# Patient Record
Sex: Male | Born: 1955 | Race: White | Hispanic: No | Marital: Married | Smoking: Never smoker
Health system: Southern US, Community
[De-identification: ages and names within clinical notes are randomized; demographics above are authoritative.]

## PROBLEM LIST (undated history)

## (undated) DIAGNOSIS — J302 Other seasonal allergic rhinitis: Secondary | ICD-10-CM

## (undated) DIAGNOSIS — Z789 Other specified health status: Secondary | ICD-10-CM

## (undated) HISTORY — PX: BACK SURGERY: SHX140

---

## 2014-10-26 ENCOUNTER — Ambulatory Visit: Payer: Self-pay | Admitting: Family Medicine

## 2018-10-19 ENCOUNTER — Other Ambulatory Visit: Payer: Self-pay

## 2018-10-19 ENCOUNTER — Emergency Department: Payer: Managed Care, Other (non HMO)

## 2018-10-19 ENCOUNTER — Encounter: Payer: Self-pay | Admitting: *Deleted

## 2018-10-19 ENCOUNTER — Inpatient Hospital Stay
Admission: EM | Admit: 2018-10-19 | Discharge: 2018-10-22 | DRG: 247 | Disposition: A | Discharge status: Home / Self Care | Payer: Managed Care, Other (non HMO) | Attending: Internal Medicine | Admitting: Internal Medicine

## 2018-10-19 DIAGNOSIS — I209 Angina pectoris, unspecified: Secondary | ICD-10-CM

## 2018-10-19 DIAGNOSIS — I10 Essential (primary) hypertension: Secondary | ICD-10-CM | POA: Diagnosis present

## 2018-10-19 DIAGNOSIS — R739 Hyperglycemia, unspecified: Secondary | ICD-10-CM | POA: Diagnosis present

## 2018-10-19 DIAGNOSIS — R079 Chest pain, unspecified: Secondary | ICD-10-CM | POA: Diagnosis present

## 2018-10-19 DIAGNOSIS — E785 Hyperlipidemia, unspecified: Secondary | ICD-10-CM | POA: Diagnosis present

## 2018-10-19 DIAGNOSIS — I214 Non-ST elevation (NSTEMI) myocardial infarction: Principal | ICD-10-CM | POA: Diagnosis present

## 2018-10-19 HISTORY — DX: Other specified health status: Z78.9

## 2018-10-19 HISTORY — DX: Other seasonal allergic rhinitis: J30.2

## 2018-10-19 LAB — BASIC METABOLIC PANEL
ANION GAP: 6 (ref 5–15)
BUN: 11 mg/dL (ref 8–23)
CALCIUM: 9.2 mg/dL (ref 8.9–10.3)
CO2: 30 mmol/L (ref 22–32)
Chloride: 103 mmol/L (ref 98–111)
Creatinine, Ser: 1.07 mg/dL (ref 0.61–1.24)
GFR calc Af Amer: >60 mL/min (ref >60)
GFR calc non Af Amer: >60 mL/min (ref >60)
GLUCOSE: 166 mg/dL — AB (ref 70–99)
POTASSIUM: 3.9 mmol/L (ref 3.5–5.1)
Sodium: 139 mmol/L (ref 135–145)

## 2018-10-19 LAB — CBC
HCT: 47.9 % (ref 39.0–52.0)
HEMATOCRIT: 48 % (ref 39.0–52.0)
HEMOGLOBIN: 16.6 g/dL (ref 13.0–17.0)
HEMOGLOBIN: 16.6 g/dL (ref 13.0–17.0)
MCH: 29 pg (ref 26.0–34.0)
MCH: 29.2 pg (ref 26.0–34.0)
MCHC: 34.7 g/dL (ref 30.0–36.0)
MCHC: 34.6 g/dL (ref 30.0–36.0)
MCV: 83.7 fL (ref 80.0–100.0)
MCV: 84.5 fL (ref 80.0–100.0)
NRBC: 0 % (ref 0.0–0.2)
Platelets: 239 10*3/uL (ref 150–400)
Platelets: 268 10*3/uL (ref 150–400)
RBC: 5.72 MIL/uL (ref 4.22–5.81)
RBC: 5.68 MIL/uL (ref 4.22–5.81)
RDW: 11.7 % (ref 11.5–15.5)
RDW: 11.6 % (ref 11.5–15.5)
WBC: 5.1 10*3/uL (ref 4.0–10.5)
WBC: 6.2 10*3/uL (ref 4.0–10.5)
nRBC: 0 % (ref 0.0–0.2)

## 2018-10-19 LAB — PROTIME-INR
INR: 0.92
Prothrombin Time: 12.3 seconds (ref 11.4–15.2)

## 2018-10-19 LAB — LIPID PANEL
CHOL/HDL RATIO: 7 ratio
CHOLESTEROL: 216 mg/dL — AB (ref 0–200)
HDL: 31 mg/dL — AB (ref >40)
LDL Cholesterol: 171 mg/dL — ABNORMAL HIGH (ref 0–99)
Triglycerides: 71 mg/dL (ref <150)
VLDL: 14 mg/dL (ref 0–40)

## 2018-10-19 LAB — APTT: APTT: 26 s (ref 24–36)

## 2018-10-19 LAB — HEMOGLOBIN A1C
Hgb A1c MFr Bld: 5.3 % (ref 4.8–5.6)
Mean Plasma Glucose: 105.41 mg/dL

## 2018-10-19 LAB — TROPONIN I
Troponin I: 0.11 ng/mL (ref <0.03)
Troponin I: 0.31 ng/mL (ref <0.03)
Troponin I: 0.68 ng/mL (ref <0.03)

## 2018-10-19 MED ORDER — CARVEDILOL 3.125 MG PO TABS
3.1250 mg | ORAL_TABLET | Freq: Two times a day (BID) | ORAL | Status: DC
  Administered 2018-10-19 – 2018-10-22 (×5): 3.125 mg via ORAL
  Filled 2018-10-19 (×6): qty 1

## 2018-10-19 MED ORDER — DOCUSATE SODIUM 100 MG PO CAPS: 100.0000 mg | ORAL_CAPSULE | Freq: Two times a day (BID) | ORAL | Status: DC | PRN

## 2018-10-19 MED ORDER — HEPARIN SODIUM (PORCINE) 5000 UNIT/ML IJ SOLN
5000.0000 [IU] | Freq: Three times a day (TID) | INTRAMUSCULAR | Status: DC
  Filled 2018-10-19: qty 1

## 2018-10-19 MED ORDER — ASPIRIN 81 MG PO CHEW
324.0000 mg | CHEWABLE_TABLET | Freq: Once | ORAL | Status: AC
  Administered 2018-10-19: 324 mg via ORAL
  Filled 2018-10-19: qty 4

## 2018-10-19 MED ORDER — HEPARIN (PORCINE) 25000 UT/250ML-% IV SOLN
1150.0000 [IU]/h | INTRAVENOUS | Status: DC
  Administered 2018-10-19 – 2018-10-20 (×2): 1150 [IU]/h via INTRAVENOUS
  Filled 2018-10-19 (×2): qty 250

## 2018-10-19 MED ORDER — HEPARIN BOLUS VIA INFUSION
4000.0000 [IU] | Freq: Once | INTRAVENOUS | Status: AC
  Administered 2018-10-19: 4000 [IU] via INTRAVENOUS
  Filled 2018-10-19: qty 4000

## 2018-10-19 MED ORDER — ADULT MULTIVITAMIN W/MINERALS CH
1.0000 | ORAL_TABLET | Freq: Every day | ORAL | Status: DC
  Administered 2018-10-19 – 2018-10-22 (×3): 1 via ORAL
  Filled 2018-10-19 (×3): qty 1

## 2018-10-19 MED ORDER — NITROGLYCERIN 0.4 MG SL SUBL: 0.4000 mg | SUBLINGUAL_TABLET | SUBLINGUAL | Status: DC | PRN

## 2018-10-19 NOTE — ED Provider Notes (Signed)
[facility] Emergency Department Provider Note   ____________________________________________   First MD Initiated Contact with Patient 10/19/18 1203     (approximate)  I have reviewed the triage vital signs and the nursing notes.   HISTORY  Chief Complaint Chest Pain    HPI Chad Henderson is a 62 y.o. male patient reports 2 nights ago he experienced dull heavy chest pain while laying down.  He could stand up and it got better after 10 or 15 minutes.  Last night it happened again but lasted longer and the pain continued through in the morning.  He has not had any pain or shortness of breath or sweating with walking or exercise although yesterday he did not exercise much at all.  He is not having any pain or tightness now.  He has not had any shortness of breath nausea or vomiting or sweating with any of the episodes of pain.  He is never had this before either.  Past Medical History:  Diagnosis Date  . Seasonal allergies     There are no active problems to display for this patient.   Past Surgical History:  Procedure Laterality Date  . BACK SURGERY      Prior to Admission medications   Not on File    Allergies Patient has no known allergies.  No family history on file.  Social History Social History   Tobacco Use  . Smoking status: Never Smoker  . Smokeless tobacco: Never Used  Substance Use Topics  . Alcohol use: Yes    Comment: occasionally  . Drug use: Never    Review of Systems  Constitutional: No fever/chills Eyes: No visual changes. ENT: No sore throat. Cardiovascular: Denies chest pain at present. Respiratory: Denies shortness of breath. Gastrointestinal: No abdominal pain.  No nausea, no vomiting.  No diarrhea.  No constipation. Genitourinary: Negative for dysuria. Musculoskeletal: Negative for back pain. Skin: Negative for rash. Neurological: Negative for headaches, focal  weakness ____________________________________________   PHYSICAL EXAM:  VITAL SIGNS: ED Triage Vitals  Enc Vitals Group     BP 10/19/18 1150 (!) 185/92     Pulse Rate 10/19/18 1150 82     Resp 10/19/18 1150 16     Temp 10/19/18 1150 97.6 F (36.4 C)     Temp Source 10/19/18 1150 Oral     SpO2 10/19/18 1150 100 %     Weight 10/19/18 1152 195 lb (88.5 kg)     Height 10/19/18 1152 5\' 10"  (1.778 m)     Head Circumference --      Peak Flow --      Pain Score 10/19/18 1151 7     Pain Loc --      Pain Edu? --      Excl. in GC? --     Constitutional: Alert and oriented. Well appearing and in no acute distress. Eyes: Conjunctivae are normal.  Head: Atraumatic. Nose: No congestion/rhinnorhea. Mouth/Throat: Mucous membranes are moist.  Oropharynx non-erythematous. Neck: No stridor.   Cardiovascular: Normal rate, regular rhythm. Grossly normal heart sounds.  Good peripheral circulation. Respiratory: Normal respiratory effort.  No retractions. Lungs CTAB. Gastrointestinal: Soft and nontender. No distention. No abdominal bruits. No CVA tenderness. Musculoskeletal: No lower extremity tenderness nor edema.   Neurologic:  Normal speech and language. No gross focal neurologic deficits are appreciated. No gait instability. Skin:  Skin is warm, dry and intact. No rash noted. Psychiatric: Mood and affect are normal. Speech and behavior are normal.  ____________________________________________   LABS (all labs ordered are listed, but only abnormal results are displayed)  Labs Reviewed  BASIC METABOLIC PANEL - Abnormal; Notable for the following components:      Result Value   Glucose, Bld 166 (*)    All other components within normal limits  TROPONIN I - Abnormal; Notable for the following components:   Troponin I 0.11 (*)    All other components within normal limits  CBC  PROTIME-INR  APTT   ____________________________________________  EKG  EKG read and interpreted by me  shows normal sinus rhythm rate of 70 left axis some T wave inversion laterally and in V2.  There is some ST elevation in aVL only and V2 only. ____________________________________________  RADIOLOGY  ED MD interpretation: Chest x-ray read by radiology reviewed by me shows no acute disease Official radiology report(s): Dg Chest 2 View  Result Date: 10/19/2018 CLINICAL DATA:  Two days of chest pain. EXAM: CHEST - 2 VIEW COMPARISON:  None. FINDINGS: Lungs are adequately inflated without consolidation or effusion. Cardiomediastinal silhouette is within normal. Minimal degenerative change of the spine. IMPRESSION: No active cardiopulmonary disease. Electronically Signed   By: Elberta Fortis M.D.   On: 10/19/2018 12:19    ____________________________________________   PROCEDURES  Procedure(s) performed:   Procedures  Critical Care performed:   ____________________________________________   INITIAL IMPRESSION / ASSESSMENT AND PLAN / ED COURSE  Patient's symptoms with the pain while laying down to get better with standing sound like reflux however his troponin is elevated significantly and his EKG is abnormal.  I cannot find any old EKGs anywhere.  We will have to admit this gentleman the diagnosis of NSTEMI      ____________________________________________   FINAL CLINICAL IMPRESSION(S) / ED DIAGNOSES  Final diagnoses:  NSTEMI (non-ST elevated myocardial infarction) [facility])     ED Discharge Orders    None       Note:  This document was prepared using Dragon voice recognition software and may include unintentional dictation errors.    Arnaldo Natal, MD 10/19/18 1314

## 2018-10-19 NOTE — ED Notes (Signed)
Dr. Darnelle Catalan aware of Troponin of 0.11.

## 2018-10-19 NOTE — Consult Note (Signed)
[facility] Cardiology  CARDIOLOGY CONSULT NOTE  Patient ID: Chad Henderson MRN: [id number] DOB/AGE: 62-Mar-1957 34 y.o.  Admit date: 10/19/2018 Referring Physician Madelon Lips Primary Physician [facility] Primary Cardiologist  Reason for Consultation new onset chest pain  HPI: 62 year old gentleman referred for evaluation of new onset chest pain.  She reports that he has been in his usual state of health until last 2 nights experienced substernal chest pressure when trying to sleep.  Patient describes substernal chest tightness and pressure without radiation, nausea or vomiting.  Patient presents to [facility] emergency room where he was chest pain-free.  ECG revealed sinus rhythm at 75 bpm lateral T wave abnormalities.  Admission labs notable for borderline elevated troponin of 0.11.  Review of systems complete and found to be negative unless listed above     Past Medical History:  Diagnosis Date  . Medical history non-contributory   . Seasonal allergies     Past Surgical History:  Procedure Laterality Date  . BACK SURGERY      Medications Prior to Admission  Medication Sig Dispense Refill Last Dose  . cetirizine (ZYRTEC) 10 MG tablet Take 10 mg by mouth daily.     . Multiple Vitamin (MULTIVITAMIN WITH MINERALS) TABS tablet Take 1 tablet by mouth daily.      Social History   Socioeconomic History  . Marital status: Married    Spouse name: Not on file  . Number of children: Not on file  . Years of education: Not on file  . Highest education level: Not on file  Occupational History  . Not on file  Social Needs  . Financial resource strain: Not on file  . Food insecurity:    Worry: Not on file    Inability: Not on file  . Transportation needs:    Medical: Not on file    Non-medical: Not on file  Tobacco Use  . Smoking status: Never Smoker  . Smokeless tobacco: Never Used  Substance and Sexual Activity  . Alcohol use: Yes    Comment: occasionally  . Drug use: Never  .  Sexual activity: Not on file  Lifestyle  . Physical activity:    Days per week: Not on file    Minutes per session: Not on file  . Stress: Not on file  Relationships  . Social connections:    Talks on phone: Not on file    Gets together: Not on file    Attends religious service: Not on file    Active member of club or organization: Not on file    Attends meetings of clubs or organizations: Not on file    Relationship status: Not on file  . Intimate partner violence:    Fear of current or ex partner: Not on file    Emotionally abused: Not on file    Physically abused: Not on file    Forced sexual activity: Not on file  Other Topics Concern  . Not on file  Social History Narrative  . Not on file    Family History  Problem Relation Age of Onset  . Lung cancer Mother       Review of systems complete and found to be negative unless listed above      PHYSICAL EXAM  General: Well developed, well nourished, in no acute distress HEENT:  Normocephalic and atramatic Neck:  No JVD.  Lungs: Clear bilaterally to auscultation and percussion. Heart: HRRR . Normal S1 and S2 without gallops or murmurs.  Abdomen: Bowel sounds  are positive, abdomen soft and non-tender  Msk:  Back normal, normal gait. Normal strength and tone for age. Extremities: No clubbing, cyanosis or edema.   Neuro: Alert and oriented X 3. Psych:  Good affect, responds appropriately  Labs:   Lab Results  Component Value Date   WBC 5.1 10/19/2018   HGB 16.6 10/19/2018   HCT 47.9 10/19/2018   MCV 83.7 10/19/2018   PLT 239 10/19/2018    Recent Labs  Lab 10/19/18 1157  NA 139  K 3.9  CL 103  CO2 30  BUN 11  CREATININE 1.07  CALCIUM 9.2  GLUCOSE 166*   Lab Results  Component Value Date   TROPONINI 0.11 (HH) 10/19/2018   No results found for: CHOL No results found for: HDL No results found for: LDLCALC No results found for: TRIG No results found for: CHOLHDL No results found for: LDLDIRECT     Radiology: Dg Chest 2 View  Result Date: 10/19/2018 CLINICAL DATA:  Two days of chest pain. EXAM: CHEST - 2 VIEW COMPARISON:  None. FINDINGS: Lungs are adequately inflated without consolidation or effusion. Cardiomediastinal silhouette is within normal. Minimal degenerative change of the spine. IMPRESSION: No active cardiopulmonary disease. Electronically Signed   By: Elberta Fortis M.D.   On: 10/19/2018 12:19    EKG: Sinus rhythm at 75 bpm with T wave abnormalities in leads I, aVL and V2  ASSESSMENT AND PLAN:   1.  New onset chest pain at rest, with abnormal ECG, borderline elevated troponin, currently chest pain-free consistent with unstable angina versus non-ST elevation myocardial infarction  Recommendations  1.  Agree with current therapy 2.  Cycle cardiac isoenzymes 3.  2D echocardiogram 4.  If patient has recurrent chest pain or notable elevation in troponin, consider starting heparin bolus and drip 5.  Cardiac catheterization with selective coronary arteriography tentatively scheduled for 10/21/2018.  The risks, benefits and alternatives of cardiac catheterization and possible percutaneous coronary intervention were explained to the patient and informed consent was obtained.  Signed: Marcina Millard MD,PhD, [facility] 10/19/2018, 2:44 PM

## 2018-10-19 NOTE — H&P (Signed)
[employer] - [place] at [facility]   PATIENT NAME: Chad Henderson    MR#:  [id number]  DATE OF BIRTH:  July 21, 1956  DATE OF ADMISSION:  10/19/2018  PRIMARY CARE PHYSICIAN: Marisue Ivan, MD   REQUESTING/REFERRING PHYSICIAN: malinda  CHIEF COMPLAINT:   Chief Complaint  Patient presents with  . Chest Pain    HISTORY OF PRESENT ILLNESS: Chad Henderson  is a 62 y.o. male with a known history of no medical issues, does not follow with physicians regularly, last time follow-up visit with PMD was in 2015 where all the blood works where fairly normal except for LDL up to 167.  He is not on any regular prescription medications at home.  He does not smoke and drinks alcohol occasionally.  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 57-month ago while he was taking a brisk walk after a ball game up to the parking lot he had slight pressure in his chest but he neglected that. For last 2 nights he is waking up in the middle of night with central chest pressure-like pain, nonradiating, not associated with cough or shortness of breath, felt as if" someone sitting on my chest", lasted for few minutes to up to an hour.  Last night he almost discarded the pain thinking it might be gas.  But when it happened again tonight, in the morning he decided to come to emergency room as he was worried.  PAST MEDICAL HISTORY:   Past Medical History:  Diagnosis Date  . Medical history non-contributory   . Seasonal allergies     PAST SURGICAL HISTORY:  Past Surgical History:  Procedure Laterality Date  . BACK SURGERY      SOCIAL HISTORY:  Social History   Tobacco Use  . Smoking status: Never Smoker  . Smokeless tobacco: Never Used  Substance Use Topics  . Alcohol use: Yes    Comment: occasionally    FAMILY HISTORY:  Family History  Problem Relation Age of Onset  . Lung cancer Mother     DRUG ALLERGIES: No Known  Allergies  REVIEW OF SYSTEMS:   CONSTITUTIONAL: No fever, fatigue or weakness.  EYES: No blurred or double vision.  EARS, NOSE, AND THROAT: No tinnitus or ear pain.  RESPIRATORY: No cough, shortness of breath, wheezing or hemoptysis.  CARDIOVASCULAR: Positive for chest pain, no orthopnea, edema.  GASTROINTESTINAL: No nausea, vomiting, diarrhea or abdominal pain.  GENITOURINARY: No dysuria, hematuria.  ENDOCRINE: No polyuria, nocturia,  HEMATOLOGY: No anemia, easy bruising or bleeding SKIN: No rash or lesion. MUSCULOSKELETAL: No joint pain or arthritis.   NEUROLOGIC: No tingling, numbness, weakness.  PSYCHIATRY: No anxiety or depression.   MEDICATIONS AT HOME:  Prior to Admission medications   Medication Sig Start Date End Date Taking? Authorizing Provider  cetirizine (ZYRTEC) 10 MG tablet Take 10 mg by mouth daily.   Yes [provider]  Multiple Vitamin (MULTIVITAMIN WITH MINERALS) TABS tablet Take 1 tablet by mouth daily.   Yes [provider]      PHYSICAL EXAMINATION:   VITAL SIGNS: Blood pressure (!) 154/97, pulse 65, temperature 97.6 F (36.4 C), temperature source Oral, resp. rate 14, height 5\' 10"  (1.778 m), weight 88.5 kg, SpO2 100 %.  GENERAL:  62 y.o.-year-old patient lying in the bed with no acute distress.  EYES: Pupils equal, round, reactive to light and accommodation. No scleral icterus. Extraocular muscles intact.  HEENT: Head atraumatic, normocephalic. Oropharynx and nasopharynx clear.  NECK:  Supple, no jugular venous distention. No thyroid enlargement, no tenderness.  LUNGS: Normal breath sounds bilaterally, no wheezing, rales,rhonchi or crepitation. No use of accessory muscles of respiration.  CARDIOVASCULAR: S1, S2 normal. No murmurs, rubs, or gallops.  ABDOMEN: Soft, nontender, nondistended. Bowel sounds present. No organomegaly or mass.  EXTREMITIES: No pedal edema, cyanosis, or clubbing.  NEUROLOGIC: Cranial nerves II through XII are  intact. Muscle strength 5/5 in all extremities. Sensation intact. Gait not checked.  PSYCHIATRIC: The patient is alert and oriented x 3.  SKIN: No obvious rash, lesion, or ulcer.   LABORATORY PANEL:   CBC Recent Labs  Lab 10/19/18 1157  WBC 5.1  HGB 16.6  HCT 47.9  PLT 239  MCV 83.7  MCH 29.0  MCHC 34.7  RDW 11.7   ------------------------------------------------------------------------------------------------------------------  Chemistries  Recent Labs  Lab 10/19/18 1157  NA 139  K 3.9  CL 103  CO2 30  GLUCOSE 166*  BUN 11  CREATININE 1.07  CALCIUM 9.2   ------------------------------------------------------------------------------------------------------------------ estimated creatinine clearance is 80.2 mL/min (by C-G formula based on SCr of 1.07 mg/dL). ------------------------------------------------------------------------------------------------------------------ No results for input(s): TSH, T4TOTAL, T3FREE, THYROIDAB in the last 72 hours.  Invalid input(s): FREET3   Coagulation profile Recent Labs  Lab 10/19/18 1312  INR 0.92   ------------------------------------------------------------------------------------------------------------------- No results for input(s): DDIMER in the last 72 hours. -------------------------------------------------------------------------------------------------------------------  Cardiac Enzymes Recent Labs  Lab 10/19/18 1157  TROPONINI 0.11*   ------------------------------------------------------------------------------------------------------------------ Invalid input(s): POCBNP  ---------------------------------------------------------------------------------------------------------------  Urinalysis No results found for: COLORURINE, APPEARANCEUR, LABSPEC, PHURINE, GLUCOSEU, HGBUR, BILIRUBINUR, KETONESUR, PROTEINUR, UROBILINOGEN, NITRITE, LEUKOCYTESUR   RADIOLOGY: Dg Chest 2 View  Result Date:  10/19/2018 CLINICAL DATA:  Two days of chest pain. EXAM: CHEST - 2 VIEW COMPARISON:  None. FINDINGS: Lungs are adequately inflated without consolidation or effusion. Cardiomediastinal silhouette is within normal. Minimal degenerative change of the spine. IMPRESSION: No active cardiopulmonary disease. Electronically Signed   By: Elberta Fortis M.D.   On: 10/19/2018 12:19    EKG: Orders placed or performed during the hospital encounter of 10/19/18  . EKG 12-Lead  . EKG 12-Lead  . ED EKG within 10 minutes  . ED EKG within 10 minutes    IMPRESSION AND PLAN:  *Anginal chest pain We will admit on telemetry, first troponin is 0.1.  No significant EKG changes except for some T wave inversion possibly due to left ventricular hypertrophy. We will keep on cardiac monitoring and follow serial troponin. If second troponin rises further, I may start on anticoagulation. Cardiology consult for further consideration of invasive test/stress test. We will also check lipid panel and hemoglobin A1c as he has not been following with PMD.  *Uncontrolled hypertension Patient does not take any medication at home and does not follow with Dr. Stann Mainland start on carvedilol for now and readjust the dose later.  *Hyperglycemia He does not have history of diabetes but not checked in the last few years. Will check hemoglobin A1c for now.   All the records are reviewed and case discussed with ED provider. Management plans discussed with the patient, family and they are in agreement.  CODE STATUS: Full code  Patient's wife and son present in the room during my visit.  TOTAL TIME TAKING CARE OF THIS PATIENT: 45 minutes.  Explained the finding and the plan to patient and his family, they understand and agree.  Altamese Dilling M.D on 10/19/2018   Between 7am to 6pm - Pager - [phone]  After 6pm go to [email or web address] -  password [email or web address]  [facility]  Office  [phone]  CC: Primary  care physician; Marisue Ivan, MD   Note: This dictation was prepared with Dragon dictation along with smaller phrase technology. Any transcriptional errors that result from this process are unintentional.

## 2018-10-19 NOTE — Consult Note (Signed)
ANTICOAGULATION CONSULT NOTE - Initial Consult  Pharmacy Consult for Heparin Indication: chest pain/ACS   No Known Allergies  Patient Measurements: Height: 5\' 10"  (177.8 cm) Weight: 195 lb (88.5 kg) IBW/kg (Calculated) : 73 Heparin Dosing Weight: 88.5  Vital Signs: Temp: 98.9 F (37.2 C) (11/09 1951) Temp Source: Oral (11/09 1951) BP: 146/83 (11/09 1951) Pulse Rate: 59 (11/09 1951)  Labs: Recent Labs    10/19/18 1157 10/19/18 1312 10/19/18 1456 10/19/18 1905  HGB 16.6  --  16.6  --   HCT 47.9  --  48.0  --   PLT 239  --  268  --   APTT  --  26  --   --   LABPROT  --  12.3  --   --   INR  --  0.92  --   --   CREATININE 1.07  --   --   --   TROPONINI 0.11*  --  0.31* 0.68*    Estimated Creatinine Clearance: 80.2 mL/min (by C-G formula based on SCr of 1.07 mg/dL).   Medical History: Past Medical History:  Diagnosis Date  . Medical history non-contributory   . Seasonal allergies      Assessment: Baseline APTT, PT/INR, and CBC drawn and Pt is being treated for ACS/STEMI per pharmacy consult   Goal of Therapy:  Heparin level 0.3-0.7 units/ml Monitor platelets by anticoagulation protocol: Yes   Plan:   Give 4000 units bolus x 1 , then 1150units/hour Rechecking HL 11/10 @0200   Albina Billet, PharmD Clinical Pharmacist 10/19/2018 8:37 PM

## 2018-10-19 NOTE — ED Triage Notes (Signed)
Per patient's report, patient c/o chest pain for two days. Patient states chest pain is worse when lying down and improves when sitting. Patient states this morning he has had a dull, tightness and ache mid-sternal that was more present today.

## 2018-10-20 ENCOUNTER — Inpatient Hospital Stay
Admit: 2018-10-20 | Discharge: 2018-10-20 | Disposition: A | Discharge status: Home / Self Care | Payer: Managed Care, Other (non HMO) | Attending: Internal Medicine | Admitting: Internal Medicine

## 2018-10-20 ENCOUNTER — Encounter: Payer: Self-pay | Admitting: *Deleted

## 2018-10-20 ENCOUNTER — Other Ambulatory Visit: Payer: Self-pay

## 2018-10-20 LAB — BASIC METABOLIC PANEL
Anion gap: 8 (ref 5–15)
BUN: 10 mg/dL (ref 8–23)
CALCIUM: 8.8 mg/dL — AB (ref 8.9–10.3)
CO2: 25 mmol/L (ref 22–32)
Chloride: 102 mmol/L (ref 98–111)
Creatinine, Ser: 0.92 mg/dL (ref 0.61–1.24)
Glucose, Bld: 112 mg/dL — ABNORMAL HIGH (ref 70–99)
Potassium: 3.7 mmol/L (ref 3.5–5.1)
Sodium: 135 mmol/L (ref 135–145)

## 2018-10-20 LAB — TROPONIN I
TROPONIN I: 0.6 ng/mL — AB (ref <0.03)
TROPONIN I: 0.51 ng/mL — AB (ref <0.03)

## 2018-10-20 LAB — CBC
HCT: 44.2 % (ref 39.0–52.0)
Hemoglobin: 15.4 g/dL (ref 13.0–17.0)
MCH: 29.1 pg (ref 26.0–34.0)
MCHC: 34.8 g/dL (ref 30.0–36.0)
MCV: 83.6 fL (ref 80.0–100.0)
NRBC: 0 % (ref 0.0–0.2)
PLATELETS: 228 10*3/uL (ref 150–400)
RBC: 5.29 MIL/uL (ref 4.22–5.81)
RDW: 11.5 % (ref 11.5–15.5)
WBC: 7.4 10*3/uL (ref 4.0–10.5)

## 2018-10-20 LAB — HEPARIN LEVEL (UNFRACTIONATED)
HEPARIN UNFRACTIONATED: 0.5 [IU]/mL (ref 0.30–0.70)
Heparin Unfractionated: 0.53 IU/mL (ref 0.30–0.70)

## 2018-10-20 MED ORDER — ATORVASTATIN CALCIUM 20 MG PO TABS
40.0000 mg | ORAL_TABLET | Freq: Every day | ORAL | Status: DC
  Administered 2018-10-20 – 2018-10-21 (×2): 40 mg via ORAL
  Filled 2018-10-20 (×2): qty 2

## 2018-10-20 MED ORDER — LORATADINE 10 MG PO TABS
10.0000 mg | ORAL_TABLET | Freq: Every day | ORAL | Status: DC
  Administered 2018-10-20 – 2018-10-21 (×2): 10 mg via ORAL
  Filled 2018-10-20 (×2): qty 1

## 2018-10-20 MED ORDER — ASPIRIN EC 81 MG PO TBEC
81.0000 mg | DELAYED_RELEASE_TABLET | Freq: Every day | ORAL | Status: DC
  Administered 2018-10-20 – 2018-10-22 (×2): 81 mg via ORAL
  Filled 2018-10-20 (×2): qty 1

## 2018-10-20 MED ORDER — TRAZODONE HCL 50 MG PO TABS
50.0000 mg | ORAL_TABLET | Freq: Every day | ORAL | Status: DC
  Administered 2018-10-20 – 2018-10-21 (×3): 50 mg via ORAL
  Filled 2018-10-20 (×3): qty 1

## 2018-10-20 NOTE — Consult Note (Signed)
ANTICOAGULATION CONSULT NOTE - Initial Consult  Pharmacy Consult for Heparin Indication: chest pain/ACS   No Known Allergies  Patient Measurements: Height: 5\' 10"  (177.8 cm) Weight: 195 lb (88.5 kg) IBW/kg (Calculated) : 73 Heparin Dosing Weight: 88.5  Vital Signs: Temp: 97.8 F (36.6 C) (11/10 0743) Temp Source: Oral (11/10 0743) BP: 139/70 (11/10 0743) Pulse Rate: 56 (11/10 0743)  Labs: Recent Labs    10/19/18 1157 10/19/18 1312 10/19/18 1456 10/19/18 1905 10/19/18 2349 10/20/18 0204 10/20/18 0756  HGB 16.6  --  16.6  --   --  15.4  --   HCT 47.9  --  48.0  --   --  44.2  --   PLT 239  --  268  --   --  228  --   APTT  --  26  --   --   --   --   --   LABPROT  --  12.3  --   --   --   --   --   INR  --  0.92  --   --   --   --   --   HEPARINUNFRC  --   --   --   --   --  0.53 0.50  CREATININE 1.07  --   --   --   --  0.92  --   TROPONINI 0.11*  --  0.31* 0.68* 0.60* 0.51*  --     Estimated Creatinine Clearance: 93.3 mL/min (by C-G formula based on SCr of 0.92 mg/dL).   Medical History: Past Medical History:  Diagnosis Date  . Medical history non-contributory   . Seasonal allergies      Assessment: Baseline APTT, PT/INR, and CBC drawn and Pt is being treated for ACS/STEMI per pharmacy consult   Goal of Therapy:  Heparin level 0.3-0.7 units/ml Monitor platelets by anticoagulation protocol: Yes   Plan:  Heparin level therapeutic. This is the second therapeutic level. Will check next level in the AM along with CBC  Rickesha Veracruz D Riddick Nuon, Pharm.D, BCPS Clinical Pharmacist 10/20/2018 8:47 AM

## 2018-10-20 NOTE — Progress Notes (Addendum)
Lab called and reported a 0.60 troponin level and requesting for a med to help sleep. notify prime. Will continue to monitor.  Update 0107: Dr. Valetta Close ordered trazodone 50 mg tablet oral daily at bedtime. Will continue to monitor.

## 2018-10-20 NOTE — Plan of Care (Signed)
  Problem: Health Behavior/Discharge Planning: Goal: Ability to manage health-related needs will improve Outcome: Progressing   Problem: Pain Managment: Goal: General experience of comfort will improve Outcome: Progressing   

## 2018-10-20 NOTE — Consult Note (Signed)
ANTICOAGULATION CONSULT NOTE - Initial Consult  Pharmacy Consult for Heparin Indication: chest pain/ACS   No Known Allergies  Patient Measurements: Height: 5\' 10"  (177.8 cm) Weight: 195 lb (88.5 kg) IBW/kg (Calculated) : 73 Heparin Dosing Weight: 88.5  Vital Signs: Temp: 98.9 F (37.2 C) (11/09 1951) Temp Source: Oral (11/09 1951) BP: 146/83 (11/09 1951) Pulse Rate: 59 (11/09 1951)  Labs: Recent Labs    10/19/18 1157 10/19/18 1312 10/19/18 1456 10/19/18 1905 10/19/18 2349 10/20/18 0204  HGB 16.6  --  16.6  --   --  15.4  HCT 47.9  --  48.0  --   --  44.2  PLT 239  --  268  --   --  228  APTT  --  26  --   --   --   --   LABPROT  --  12.3  --   --   --   --   INR  --  0.92  --   --   --   --   HEPARINUNFRC  --   --   --   --   --  0.53  CREATININE 1.07  --   --   --   --  0.92  TROPONINI 0.11*  --  0.31* 0.68* 0.60* 0.51*    Estimated Creatinine Clearance: 93.3 mL/min (by C-G formula based on SCr of 0.92 mg/dL).   Medical History: Past Medical History:  Diagnosis Date  . Medical history non-contributory   . Seasonal allergies      Assessment: Baseline APTT, PT/INR, and CBC drawn and Pt is being treated for ACS/STEMI per pharmacy consult   Goal of Therapy:  Heparin level 0.3-0.7 units/ml Monitor platelets by anticoagulation protocol: Yes   Plan:  11/10 @ 0200 HL 0.58 therapeutic. Will continue current rate and will recheck HL @ 0800. Hgb down one unit -- maybe dilutional, trops trending down, will continue to monitor.  Thomasene Ripple, PharmD Clinical Pharmacist 10/20/2018 4:07 AM

## 2018-10-20 NOTE — Progress Notes (Signed)
Patient ID: Chad Henderson, male   DOB: [birth year], 62 y.o.   MRN: [id number]  [employer] [facility] PROGRESS NOTE  Chad Henderson [id number]:[id number] DOB: [birth year] DOA: 10/19/2018 PCP: Chad Ivan, MD  HPI/Subjective: Patient feeling okay now.  Had chest pain Thursday and Friday night.  No complaints of chest pain or shortness of breath now.  Really nervous about blood draws, needles and this procedure.  Objective: Vitals:   10/20/18 0422 10/20/18 0743  BP: 128/73 139/70  Pulse: 61 (!) 56  Resp: 16 18  Temp: 97.8 F (36.6 C) 97.8 F (36.6 C)  SpO2: 97% 99%    Filed Weights   10/19/18 1152  Weight: 88.5 kg    ROS: Review of Systems  Constitutional: Negative for chills and fever.  Eyes: Negative for blurred vision.  Respiratory: Negative for cough and shortness of breath.   Cardiovascular: Negative for chest pain.  Gastrointestinal: Negative for abdominal pain, constipation, diarrhea, nausea and vomiting.  Genitourinary: Negative for dysuria.  Musculoskeletal: Negative for joint pain.  Neurological: Negative for dizziness and headaches.   Exam: Physical Exam  Constitutional: He is oriented to person, place, and time.  HENT:  Nose: No mucosal edema.  Mouth/Throat: No oropharyngeal exudate or posterior oropharyngeal edema.  Eyes: Pupils are equal, round, and reactive to light. Conjunctivae, EOM and lids are normal.  Neck: No JVD present. Carotid bruit is not present. No edema present. No thyroid mass and no thyromegaly present.  Cardiovascular: S1 normal and S2 normal. Exam reveals no gallop.  No murmur heard. Pulses:      Dorsalis pedis pulses are 2+ on the right side, and 2+ on the left side.  Respiratory: No respiratory distress. He has no wheezes. He has no rhonchi. He has no rales.  GI: Soft. Bowel sounds are normal. There is no tenderness.  Musculoskeletal:       Right ankle: He exhibits no swelling.       Left ankle: He exhibits no swelling.   Lymphadenopathy:    He has no cervical adenopathy.  Neurological: He is alert and oriented to person, place, and time. No cranial nerve deficit.  Skin: Skin is warm. No rash noted. Nails show no clubbing.  Psychiatric: He has a normal mood and affect.      Data Reviewed: Basic Metabolic Panel: Recent Labs  Lab 10/19/18 1157 10/20/18 0204  NA 139 135  K 3.9 3.7  CL 103 102  CO2 30 25  GLUCOSE 166* 112*  BUN 11 10  CREATININE 1.07 0.92  CALCIUM 9.2 8.8*   CBC: Recent Labs  Lab 10/19/18 1157 10/19/18 1456 10/20/18 0204  WBC 5.1 6.2 7.4  HGB 16.6 16.6 15.4  HCT 47.9 48.0 44.2  MCV 83.7 84.5 83.6  PLT 239 268 228   Cardiac Enzymes: Recent Labs  Lab 10/19/18 1157 10/19/18 1456 10/19/18 1905 10/19/18 2349 10/20/18 0204  TROPONINI 0.11* 0.31* 0.68* 0.60* 0.51*     Studies: Dg Chest 2 View  Result Date: 10/19/2018 CLINICAL DATA:  Two days of chest pain. EXAM: CHEST - 2 VIEW COMPARISON:  None. FINDINGS: Lungs are adequately inflated without consolidation or effusion. Cardiomediastinal silhouette is within normal. Minimal degenerative change of the spine. IMPRESSION: No active cardiopulmonary disease. Electronically Signed   By: Elberta Fortis M.D.   On: 10/19/2018 12:19    Scheduled Meds: . carvedilol  3.125 mg Oral BID WC  . multivitamin with minerals  1 tablet Oral Daily  . traZODone  50 mg Oral QHS  Continuous Infusions: . heparin 1,150 Units/hr (10/20/18 1249)    Assessment/Plan:  1. NSTEMI.  Patient on heparin drip.  Patient started on Coreg.  I will start aspirin and atorvastatin.  Troponin peaked at 0.68 and trending better to 0.51.  For cardiac catheterization tomorrow. 2. Hyperlipidemia unspecified LDL 171.  Start Lipitor. 3. Hypertension on low-dose Coreg  Code Status:     Code Status Orders  (From admission, onward)         Start     Ordered   10/19/18 1450  Full code  Continuous     10/19/18 1449        Code Status History     This patient has a current code status but no historical code status.     Disposition Plan: Depending on results of cardiac catheterization tomorrow may go home tomorrow versus Tuesday  Consultants:  Cardiology  Time spent: 28 minutes  [employer]

## 2018-10-20 NOTE — Progress Notes (Signed)
[facility] Cardiology  SUBJECTIVE: Patient laying in bed, denies chest pain   Vitals:   10/19/18 1511 10/19/18 1951 10/20/18 0422 10/20/18 0743  BP: (!) 148/81 (!) 146/83 128/73 139/70  Pulse: 64 (!) 59 61 (!) 56  Resp: 19 17 16 18   Temp: 97.6 F (36.4 C) 98.9 F (37.2 C) 97.8 F (36.6 C) 97.8 F (36.6 C)  TempSrc: Oral Oral Oral Oral  SpO2: 100% 98% 97% 99%  Weight:      Height:         Intake/Output Summary (Last 24 hours) at 10/20/2018 1035 Last data filed at 10/20/2018 1029 Gross per 24 hour  Intake 94.48 ml  Output -  Net 94.48 ml      PHYSICAL EXAM  General: Well developed, well nourished, in no acute distress HEENT:  Normocephalic and atramatic Neck:  No JVD.  Lungs: Clear bilaterally to auscultation and percussion. Heart: HRRR . Normal S1 and S2 without gallops or murmurs.  Abdomen: Bowel sounds are positive, abdomen soft and non-tender  Msk:  Back normal, normal gait. Normal strength and tone for age. Extremities: No clubbing, cyanosis or edema.   Neuro: Alert and oriented X 3. Psych:  Good affect, responds appropriately   LABS: Basic Metabolic Panel: Recent Labs    10/19/18 1157 10/20/18 0204  NA 139 135  K 3.9 3.7  CL 103 102  CO2 30 25  GLUCOSE 166* 112*  BUN 11 10  CREATININE 1.07 0.92  CALCIUM 9.2 8.8*   Liver Function Tests: No results for input(s): AST, ALT, ALKPHOS, BILITOT, PROT, ALBUMIN in the last 72 hours. No results for input(s): LIPASE, AMYLASE in the last 72 hours. CBC: Recent Labs    10/19/18 1456 10/20/18 0204  WBC 6.2 7.4  HGB 16.6 15.4  HCT 48.0 44.2  MCV 84.5 83.6  PLT 268 228   Cardiac Enzymes: Recent Labs    10/19/18 1905 10/19/18 2349 10/20/18 0204  TROPONINI 0.68* 0.60* 0.51*   BNP: Invalid input(s): POCBNP D-Dimer: No results for input(s): DDIMER in the last 72 hours. Hemoglobin A1C: Recent Labs    10/19/18 1456  HGBA1C 5.3   Fasting Lipid Panel: Recent Labs    10/19/18 1456  CHOL 216*  HDL 31*   LDLCALC 171*  TRIG 71  CHOLHDL 7.0   Thyroid Function Tests: No results for input(s): TSH, T4TOTAL, T3FREE, THYROIDAB in the last 72 hours.  Invalid input(s): FREET3 Anemia Panel: No results for input(s): VITAMINB12, FOLATE, FERRITIN, TIBC, IRON, RETICCTPCT in the last 72 hours.  Dg Chest 2 View  Result Date: 10/19/2018 CLINICAL DATA:  Two days of chest pain. EXAM: CHEST - 2 VIEW COMPARISON:  None. FINDINGS: Lungs are adequately inflated without consolidation or effusion. Cardiomediastinal silhouette is within normal. Minimal degenerative change of the spine. IMPRESSION: No active cardiopulmonary disease. Electronically Signed   By: Elberta Fortis M.D.   On: 10/19/2018 12:19     Echo pending  TELEMETRY: Sinus rhythm:  ASSESSMENT AND PLAN:  Principal Problem:   Ischemic chest pain (HCC) Active Problems:   Chest pain   NSTEMI (non-ST elevated myocardial infarction) (HCC)    1.  Non-ST elevation myocardial infarction, peak troponin 0 0.68, lateral T wave abnormalities, no recurrent chest pain on heparin drip  Recommendations  1.  Continue current medications 2.  Continue heparin drip 3.  Review 2D echocardiogram 4.  Cardiac catheterization and possible PCI scheduled for a.m..  The risks, benefits and alternatives of cardiac catheterization and possible PCI were explained  to the patient and informed consent was obtained.   Marcina Millard, MD, PhD, [facility] 10/20/2018 10:35 AM

## 2018-10-20 NOTE — Plan of Care (Signed)
  Problem: Pain Managment: Goal: General experience of comfort will improve Outcome: Progressing   Problem: Health Behavior/Discharge Planning: Goal: Ability to manage health-related needs will improve Outcome: Progressing   Problem: Safety: Goal: Ability to remain free from injury will improve Outcome: Progressing

## 2018-10-21 ENCOUNTER — Encounter: Payer: Self-pay | Admitting: *Deleted

## 2018-10-21 ENCOUNTER — Encounter
Admission: EM | Disposition: A | Discharge status: Home / Self Care | Payer: Self-pay | Source: Home / Self Care | Attending: Internal Medicine

## 2018-10-21 HISTORY — PX: LEFT HEART CATH: CATH118248

## 2018-10-21 HISTORY — PX: CORONARY STENT INTERVENTION: CATH118234

## 2018-10-21 LAB — CBC
HEMATOCRIT: 44.5 % (ref 39.0–52.0)
Hemoglobin: 15.5 g/dL (ref 13.0–17.0)
MCH: 29 pg (ref 26.0–34.0)
MCHC: 34.8 g/dL (ref 30.0–36.0)
MCV: 83.2 fL (ref 80.0–100.0)
Platelets: 233 10*3/uL (ref 150–400)
RBC: 5.35 MIL/uL (ref 4.22–5.81)
RDW: 11.5 % (ref 11.5–15.5)
WBC: 6.2 10*3/uL (ref 4.0–10.5)
nRBC: 0 % (ref 0.0–0.2)

## 2018-10-21 LAB — POCT ACTIVATED CLOTTING TIME: Activated Clotting Time: 456 seconds

## 2018-10-21 LAB — HEPARIN LEVEL (UNFRACTIONATED): HEPARIN UNFRACTIONATED: 0.45 [IU]/mL (ref 0.30–0.70)

## 2018-10-21 LAB — HIV ANTIBODY (ROUTINE TESTING W REFLEX): HIV Screen 4th Generation wRfx: NONREACTIVE

## 2018-10-21 SURGERY — LEFT HEART CATH
Anesthesia: Moderate Sedation

## 2018-10-21 MED ORDER — VERAPAMIL HCL 2.5 MG/ML IV SOLN
INTRAVENOUS | Status: AC
  Filled 2018-10-21: qty 2

## 2018-10-21 MED ORDER — ASPIRIN 81 MG PO CHEW
81.0000 mg | CHEWABLE_TABLET | ORAL | Status: AC
  Administered 2018-10-21: 08:00:00 via ORAL

## 2018-10-21 MED ORDER — NITROGLYCERIN 1 MG/10 ML FOR IR/CATH LAB
INTRA_ARTERIAL | Status: DC | PRN
  Administered 2018-10-21: 200 ug via INTRACORONARY

## 2018-10-21 MED ORDER — TICAGRELOR 90 MG PO TABS
ORAL_TABLET | ORAL | Status: DC | PRN
  Administered 2018-10-21: 180 mg via ORAL

## 2018-10-21 MED ORDER — TICAGRELOR 90 MG PO TABS
ORAL_TABLET | ORAL | Status: AC
  Filled 2018-10-21: qty 1

## 2018-10-21 MED ORDER — SODIUM CHLORIDE 0.9 % IV SOLN: 250.0000 mL | INTRAVENOUS | Status: DC | PRN

## 2018-10-21 MED ORDER — IOPAMIDOL (ISOVUE-300) INJECTION 61%
INTRAVENOUS | Status: DC | PRN
  Administered 2018-10-21: 200 mL via INTRA_ARTERIAL

## 2018-10-21 MED ORDER — SODIUM CHLORIDE 0.9% FLUSH: 3.0000 mL | INTRAVENOUS | Status: DC | PRN

## 2018-10-21 MED ORDER — SODIUM CHLORIDE 0.9% FLUSH: 3.0000 mL | Freq: Two times a day (BID) | INTRAVENOUS | Status: DC

## 2018-10-21 MED ORDER — SODIUM CHLORIDE 0.9% FLUSH
3.0000 mL | Freq: Two times a day (BID) | INTRAVENOUS | Status: DC
  Administered 2018-10-21: 3 mL via INTRAVENOUS

## 2018-10-21 MED ORDER — SODIUM CHLORIDE 0.9 % IV SOLN: 0.2500 mg/kg/h | INTRAVENOUS | Status: AC

## 2018-10-21 MED ORDER — SODIUM CHLORIDE 0.9 % WEIGHT BASED INFUSION: 1.0000 mL/kg/h | INTRAVENOUS | Status: DC

## 2018-10-21 MED ORDER — ASPIRIN 81 MG PO CHEW
CHEWABLE_TABLET | ORAL | Status: DC | PRN
  Administered 2018-10-21: 243 mg via ORAL

## 2018-10-21 MED ORDER — DOPAMINE-DEXTROSE 3.2-5 MG/ML-% IV SOLN
INTRAVENOUS | Status: AC | PRN
  Administered 2018-10-21: 10 ug/kg/min via INTRAVENOUS

## 2018-10-21 MED ORDER — BIVALIRUDIN TRIFLUOROACETATE 250 MG IV SOLR
INTRAVENOUS | Status: AC
  Filled 2018-10-21: qty 250

## 2018-10-21 MED ORDER — HEPARIN SODIUM (PORCINE) 1000 UNIT/ML IJ SOLN
INTRAMUSCULAR | Status: AC
  Filled 2018-10-21: qty 1

## 2018-10-21 MED ORDER — SODIUM CHLORIDE 0.9 % WEIGHT BASED INFUSION: 1.0000 mL/kg/h | INTRAVENOUS | Status: AC

## 2018-10-21 MED ORDER — ATROPINE SULFATE 1 MG/10ML IJ SOSY
PREFILLED_SYRINGE | INTRAMUSCULAR | Status: AC
  Filled 2018-10-21: qty 10

## 2018-10-21 MED ORDER — ASPIRIN 81 MG PO CHEW
81.0000 mg | CHEWABLE_TABLET | Freq: Every day | ORAL | Status: DC
  Administered 2018-10-21: 81 mg via ORAL
  Filled 2018-10-21: qty 1

## 2018-10-21 MED ORDER — ONDANSETRON HCL 4 MG/2ML IJ SOLN: 4.0000 mg | Freq: Four times a day (QID) | INTRAMUSCULAR | Status: DC | PRN

## 2018-10-21 MED ORDER — HEPARIN SODIUM (PORCINE) 1000 UNIT/ML IJ SOLN
INTRAMUSCULAR | Status: DC | PRN
  Administered 2018-10-21: 4500 [IU] via INTRAVENOUS

## 2018-10-21 MED ORDER — ACETAMINOPHEN 325 MG PO TABS: 650.0000 mg | ORAL_TABLET | ORAL | Status: DC | PRN

## 2018-10-21 MED ORDER — ATROPINE SULFATE 1 MG/10ML IJ SOSY
PREFILLED_SYRINGE | INTRAMUSCULAR | Status: DC | PRN
  Administered 2018-10-21: 0.5 mg via INTRAVENOUS

## 2018-10-21 MED ORDER — ASPIRIN 81 MG PO CHEW
CHEWABLE_TABLET | ORAL | Status: AC
  Filled 2018-10-21: qty 3

## 2018-10-21 MED ORDER — NITROGLYCERIN 5 MG/ML IV SOLN
INTRAVENOUS | Status: AC
  Filled 2018-10-21: qty 10

## 2018-10-21 MED ORDER — HYDRALAZINE HCL 20 MG/ML IJ SOLN: 5.0000 mg | INTRAMUSCULAR | Status: AC | PRN

## 2018-10-21 MED ORDER — MIDAZOLAM HCL 2 MG/2ML IJ SOLN
INTRAMUSCULAR | Status: AC
  Filled 2018-10-21: qty 2

## 2018-10-21 MED ORDER — MIDAZOLAM HCL 2 MG/2ML IJ SOLN
INTRAMUSCULAR | Status: DC | PRN
  Administered 2018-10-21: 2 mg via INTRAVENOUS

## 2018-10-21 MED ORDER — SODIUM CHLORIDE 0.9 % IV SOLN
INTRAVENOUS | Status: AC | PRN
  Administered 2018-10-21: 1.75 mg/kg/h via INTRAVENOUS
  Administered 2018-10-21: 0.25 mg/kg/h

## 2018-10-21 MED ORDER — LABETALOL HCL 5 MG/ML IV SOLN: 10.0000 mg | INTRAVENOUS | Status: AC | PRN

## 2018-10-21 MED ORDER — DOPAMINE-DEXTROSE 3.2-5 MG/ML-% IV SOLN
INTRAVENOUS | Status: AC
  Filled 2018-10-21: qty 250

## 2018-10-21 MED ORDER — SODIUM CHLORIDE 0.9 % WEIGHT BASED INFUSION
3.0000 mL/kg/h | INTRAVENOUS | Status: AC
  Administered 2018-10-21: 3 mL/kg/h via INTRAVENOUS

## 2018-10-21 MED ORDER — FENTANYL CITRATE (PF) 100 MCG/2ML IJ SOLN
INTRAMUSCULAR | Status: AC
  Filled 2018-10-21: qty 2

## 2018-10-21 MED ORDER — TICAGRELOR 90 MG PO TABS
90.0000 mg | ORAL_TABLET | Freq: Two times a day (BID) | ORAL | Status: DC
  Administered 2018-10-21 – 2018-10-22 (×2): 90 mg via ORAL
  Filled 2018-10-21 (×2): qty 1

## 2018-10-21 MED ORDER — ASPIRIN 81 MG PO CHEW
CHEWABLE_TABLET | ORAL | Status: AC
  Filled 2018-10-21: qty 1

## 2018-10-21 MED ORDER — BIVALIRUDIN BOLUS VIA INFUSION - CUPID
INTRAVENOUS | Status: DC | PRN
  Administered 2018-10-21: 67.725 mg via INTRAVENOUS

## 2018-10-21 MED ORDER — FENTANYL CITRATE (PF) 100 MCG/2ML IJ SOLN
INTRAMUSCULAR | Status: DC | PRN
  Administered 2018-10-21: 25 ug via INTRAVENOUS
  Administered 2018-10-21: 50 ug via INTRAVENOUS
  Administered 2018-10-21: 25 ug via INTRAVENOUS

## 2018-10-21 MED ORDER — HEPARIN (PORCINE) IN NACL 1000-0.9 UT/500ML-% IV SOLN
INTRAVENOUS | Status: AC
  Filled 2018-10-21: qty 1000

## 2018-10-21 MED ORDER — TICAGRELOR 90 MG PO TABS
ORAL_TABLET | ORAL | Status: AC
  Filled 2018-10-21: qty 2

## 2018-10-21 SURGICAL SUPPLY — 16 items
BALLN TREK RX 2.25X15 (BALLOONS) ×4
BALLN ~~LOC~~ TREK RX 2.5X15 (BALLOONS) ×4
BALLOON TREK RX 2.25X15 (BALLOONS) ×2 IMPLANT
BALLOON ~~LOC~~ TREK RX 2.5X15 (BALLOONS) ×2 IMPLANT
CATH INFINITI 5 FR JL3.5 (CATHETERS) ×4 IMPLANT
CATH INFINITI 5FR ANG PIGTAIL (CATHETERS) ×4 IMPLANT
CATH INFINITI JR4 5F (CATHETERS) ×4 IMPLANT
CATH LAUNCHER 6FR EBU 3 (CATHETERS) ×4 IMPLANT
DEVICE INFLAT 30 PLUS (MISCELLANEOUS) ×4 IMPLANT
DEVICE RAD TR BAND REGULAR (VASCULAR PRODUCTS) ×4 IMPLANT
GLIDESHEATH SLEND SS 6F .021 (SHEATH) ×4 IMPLANT
KIT MANI 3VAL PERCEP (MISCELLANEOUS) ×4 IMPLANT
PACK CARDIAC CATH (CUSTOM PROCEDURE TRAY) ×4 IMPLANT
STENT SIERRA 2.25 X 18 MM (Permanent Stent) ×4 IMPLANT
WIRE ASAHI PROWATER 180CM (WIRE) ×8 IMPLANT
WIRE ROSEN-J .035X260CM (WIRE) ×4 IMPLANT

## 2018-10-21 NOTE — Progress Notes (Signed)
Per Dr. Darrold Junker, ok to start deflation of TR Band at 1220 while AngioMax gtt remains infusing. MD aware Angiomax will be infusing for 4 hrs as ordered and will be off at 1450.

## 2018-10-21 NOTE — Progress Notes (Signed)
Pt anxious and refusing attempt at second IV for procedure at this time. IV attempted on floor but was unable. Dr. Darrold Junker notified.

## 2018-10-21 NOTE — Consult Note (Signed)
ANTICOAGULATION CONSULT NOTE - Initial Consult  Pharmacy Consult for Heparin Indication: chest pain/ACS   No Known Allergies  Patient Measurements: Height: 5\' 10"  (177.8 cm) Weight: 199 lb (90.3 kg) IBW/kg (Calculated) : 73 Heparin Dosing Weight: 88.5  Vital Signs: Temp: 98.1 F (36.7 C) (11/11 0444) Temp Source: Oral (11/11 0444) BP: 138/81 (11/11 0444) Pulse Rate: 70 (11/11 0444)  Labs: Recent Labs    10/19/18 1157 10/19/18 1312 10/19/18 1456 10/19/18 1905 10/19/18 2349 10/20/18 0204 10/20/18 0756 10/21/18 0422  HGB 16.6  --  16.6  --   --  15.4  --  15.5  HCT 47.9  --  48.0  --   --  44.2  --  44.5  PLT 239  --  268  --   --  228  --  233  APTT  --  26  --   --   --   --   --   --   LABPROT  --  12.3  --   --   --   --   --   --   INR  --  0.92  --   --   --   --   --   --   HEPARINUNFRC  --   --   --   --   --  0.53 0.50 0.45  CREATININE 1.07  --   --   --   --  0.92  --   --   TROPONINI 0.11*  --  0.31* 0.68* 0.60* 0.51*  --   --     Estimated Creatinine Clearance: 94.1 mL/min (by C-G formula based on SCr of 0.92 mg/dL).   Medical History: Past Medical History:  Diagnosis Date  . Medical history non-contributory   . Seasonal allergies      Assessment: Baseline APTT, PT/INR, and CBC drawn and Pt is being treated for ACS/STEMI per pharmacy consult   Goal of Therapy:  Heparin level 0.3-0.7 units/ml Monitor platelets by anticoagulation protocol: Yes   Plan:  11/11 @ 0500 HL 0.45 therapeutic. Will continue current rate and will recheck HL w/ am labs. CBC stable will continue to monitor.  Thomasene Ripple, PharmD Clinical Pharmacist 10/21/2018 5:44 AM

## 2018-10-21 NOTE — Plan of Care (Signed)
  Problem: Activity: Goal: Risk for activity intolerance will decrease Outcome: Progressing Note:  Up independently in room, tolerating well   Problem: Nutrition: Goal: Adequate nutrition will be maintained Outcome: Progressing   Problem: Coping: Goal: Level of anxiety will decrease Outcome: Progressing   Problem: Elimination: Goal: Will not experience complications related to urinary retention Outcome: Progressing   Problem: Pain Managment: Goal: General experience of comfort will improve Outcome: Progressing Note:  No complaints of pain this shift   Problem: Safety: Goal: Ability to remain free from injury will improve Outcome: Progressing   Problem: Skin Integrity: Goal: Risk for impaired skin integrity will decrease Outcome: Progressing   Problem: Cardiac: Goal: Ability to achieve and maintain adequate cardiovascular perfusion will improve Outcome: Progressing Note:  On heparin gtt, waiting on cardiac catheterization today   Problem: Education: Goal: Knowledge of General Education information will improve Description Including pain rating scale, medication(s)/side effects and non-pharmacologic comfort measures Outcome: Completed/Met

## 2018-10-21 NOTE — Progress Notes (Signed)
Patient returned from cath via bed s/p R radial cath.  Site without swelling or hematoma.  Dressing C/D/I.  Patient states no c/o pain.  +CMST. VSS.

## 2018-10-21 NOTE — Progress Notes (Signed)
Patient off floor to cath lab at this time.  Patient anxious but agreeable to procedure.  Spoke to MD.  This RN spoke to cath lab prior to transfer.

## 2018-10-21 NOTE — Progress Notes (Signed)
Patient ID: Chad Henderson, male   DOB: [birth year], 61 y.o.   MRN: [id number]  [employer] PROGRESS NOTE  Chad Henderson BZJ:[id number] DOB: January 07, 1956 DOA: 10/19/2018 PCP: Marisue Ivan, MD  HPI/Subjective: Patient seen in the recovery area after cardiac catheterization.  Patient feels well and offers no complaints of shortness of breath or chest pain.  Objective: Vitals:   10/21/18 1440 10/21/18 1534  BP: 131/69 121/72  Pulse: 63 63  Resp: 15   Temp:  98.6 F (37 C)  SpO2: 97% 97%    Filed Weights   10/19/18 1152 10/21/18 0444 10/21/18 0850  Weight: 88.5 kg 90.3 kg 90.3 kg    ROS: Review of Systems  Constitutional: Negative for chills and fever.  Eyes: Negative for blurred vision.  Respiratory: Negative for cough and shortness of breath.   Cardiovascular: Negative for chest pain.  Gastrointestinal: Negative for abdominal pain, constipation, diarrhea, nausea and vomiting.  Genitourinary: Negative for dysuria.  Musculoskeletal: Negative for joint pain.  Neurological: Negative for dizziness and headaches.   Exam: Physical Exam  Constitutional: He is oriented to person, place, and time.  HENT:  Nose: No mucosal edema.  Mouth/Throat: No oropharyngeal exudate or posterior oropharyngeal edema.  Eyes: Pupils are equal, round, and reactive to light. Conjunctivae, EOM and lids are normal.  Neck: No JVD present. Carotid bruit is not present. No edema present. No thyroid mass and no thyromegaly present.  Cardiovascular: S1 normal and S2 normal. Exam reveals no gallop.  No murmur heard. Pulses:      Dorsalis pedis pulses are 2+ on the right side, and 2+ on the left side.  Respiratory: No respiratory distress. He has no wheezes. He has no rhonchi. He has no rales.  GI: Soft. Bowel sounds are normal. There is no tenderness.  Musculoskeletal:       Right ankle: He exhibits no swelling.       Left ankle: He exhibits no swelling.  Lymphadenopathy:    He  has no cervical adenopathy.  Neurological: He is alert and oriented to person, place, and time. No cranial nerve deficit.  Skin: Skin is warm. No rash noted. Nails show no clubbing.  Psychiatric: He has a normal mood and affect.      Data Reviewed: Basic Metabolic Panel: Recent Labs  Lab 10/19/18 1157 10/20/18 0204  NA 139 135  K 3.9 3.7  CL 103 102  CO2 30 25  GLUCOSE 166* 112*  BUN 11 10  CREATININE 1.07 0.92  CALCIUM 9.2 8.8*   CBC: Recent Labs  Lab 10/19/18 1157 10/19/18 1456 10/20/18 0204 10/21/18 0422  WBC 5.1 6.2 7.4 6.2  HGB 16.6 16.6 15.4 15.5  HCT 47.9 48.0 44.2 44.5  MCV 83.7 84.5 83.6 83.2  PLT 239 268 228 233   Cardiac Enzymes: Recent Labs  Lab 10/19/18 1157 10/19/18 1456 10/19/18 1905 10/19/18 2349 10/20/18 0204  TROPONINI 0.11* 0.31* 0.68* 0.60* 0.51*    Scheduled Meds: . aspirin  81 mg Oral Daily  . aspirin EC  81 mg Oral Daily  . atorvastatin  40 mg Oral q1800  . carvedilol  3.125 mg Oral BID WC  . loratadine  10 mg Oral QHS  . multivitamin with minerals  1 tablet Oral Daily  . sodium chloride flush  3 mL Intravenous Q12H  . ticagrelor  90 mg Oral BID  . traZODone  50 mg Oral QHS   Continuous Infusions: . sodium chloride    . sodium chloride 1 mL/kg/hr (  10/21/18 1130)    Assessment/Plan:  1. NSTEMI.  Patient started on Coreg, aspirin and Lipitor.  Brilinta started after procedure. 2. Hyperlipidemia unspecified LDL 171.  Started Lipitor. 3. Hypertension on low-dose Coreg  Code Status:     Code Status Orders  (From admission, onward)         Start     Ordered   10/19/18 1450  Full code  Continuous     10/19/18 1449        Code Status History    This patient has a current code status but no historical code status.     Disposition Plan: Likely home tomorrow.  Consultants:  Cardiology  Time spent: 29 minutes.  Case discussed with family at the bedside.  Chad Henderson [employer]

## 2018-10-22 LAB — ECHOCARDIOGRAM COMPLETE
Height: 70 in
Weight: 3120 oz

## 2018-10-22 MED ORDER — CARVEDILOL 3.125 MG PO TABS: 3.1250 mg | ORAL_TABLET | Freq: Two times a day (BID) | ORAL | 0 refills | Status: AC

## 2018-10-22 MED ORDER — TICAGRELOR 90 MG PO TABS: 90.0000 mg | ORAL_TABLET | Freq: Two times a day (BID) | ORAL | 0 refills | Status: AC

## 2018-10-22 MED ORDER — NITROGLYCERIN 0.4 MG SL SUBL: 0.4000 mg | SUBLINGUAL_TABLET | SUBLINGUAL | 0 refills | Status: AC | PRN

## 2018-10-22 MED ORDER — ATORVASTATIN CALCIUM 80 MG PO TABS
80.0000 mg | ORAL_TABLET | Freq: Every day | ORAL | Status: DC
  Filled 2018-10-22: qty 1

## 2018-10-22 MED ORDER — ATORVASTATIN CALCIUM 80 MG PO TABS: 80.0000 mg | ORAL_TABLET | Freq: Every day | ORAL | 0 refills | Status: AC

## 2018-10-22 MED ORDER — ASPIRIN 81 MG PO TBEC: 81.0000 mg | DELAYED_RELEASE_TABLET | Freq: Every day | ORAL | 0 refills | Status: AC

## 2018-10-22 NOTE — Progress Notes (Signed)
Patient discharged to home with family.  IV and tele removed prior to discharge. Patient verbalizes understanding of discharge instructions.  Discussed and handout given on post care of radial cath site.

## 2018-10-22 NOTE — Plan of Care (Signed)
  Problem: Clinical Measurements: Goal: Ability to maintain clinical measurements within normal limits will improve Outcome: Progressing Goal: Cardiovascular complication will be avoided Outcome: Progressing   Problem: Activity: Goal: Risk for activity intolerance will decrease Outcome: Progressing Note:  Up in room independently, tolerating well   Problem: Coping: Goal: Level of anxiety will decrease Outcome: Progressing Note:  Pt has anxiety about needles. Did refuse lab work this morning   Problem: Pain Managment: Goal: General experience of comfort will improve Outcome: Progressing Note:  No complaints of pain this shift   Problem: Skin Integrity: Goal: Risk for impaired skin integrity will decrease Outcome: Progressing   Problem: Cardiovascular: Goal: Vascular access site(s) Level 0-1 will be maintained Outcome: Progressing Note:  Right radial cath site at a level 0, dressing clean dry & intact, no bleeding or bruising   Problem: Nutrition: Goal: Adequate nutrition will be maintained Outcome: Completed/Met   Problem: Elimination: Goal: Will not experience complications related to bowel motility Outcome: Completed/Met Goal: Will not experience complications related to urinary retention Outcome: Completed/Met   Problem: Safety: Goal: Ability to remain free from injury will improve Outcome: Completed/Met   Problem: Clinical Measurements: Goal: Will remain free from infection Outcome: Not Applicable Goal: Respiratory complications will improve Outcome: Not Applicable

## 2018-10-22 NOTE — Progress Notes (Signed)
Patient ambulating independently in room at this time.  No c/o pain or SOB.  S/P R radial cath.  Dressing D/I.  +CMST.  Small amount of bruising noted. No hematoma.  Call bell in reach.

## 2018-10-22 NOTE — Care Management Note (Signed)
Case Management Note  Patient Details  Name: Chad Henderson MRN: [id number] Date of Birth: 02/03/1956  Subjective/Objective:    Patient from independent from home with chest pain.  Cardiac cath performed on 10-21-18.  Elevated troponin's.  Discharging today with wife.  New prescription for Brilinta; provided co-pay assist and 30 free coupon.  No current with PCP.  Following up with Cardiology.  [facility]  Declined RNCM making an appointment for him.  Independent in all adls, denies issues accessing medical care, obtaining medications or with transportation.  No discharge needs identified at present by care manager or members of care team.                      Action/Plan:   Expected Discharge Date:  10/22/18               Expected Discharge Plan:  Home/Self Care  In-House Referral:     Discharge planning Services  CM Consult  Post Acute Care Choice:    Choice offered to:     DME Arranged:    DME Agency:     HH Arranged:    HH Agency:     Status of Service:  Completed, signed off  If discussed at [employer] Meetings, dates discussed:    Additional Comments:  Sherren KernsJennifer L Doriana Mazurkiewicz, RN 10/22/2018, 8:57 AM

## 2018-10-22 NOTE — Discharge Instructions (Signed)
Acute Coronary Syndrome °Acute coronary syndrome (ACS) is a serious problem in which there is suddenly not enough blood and oxygen reaching the heart. ACS can result in chest pain or a heart attack. °What are the causes? °This condition may be caused by: °· A buildup of fat and cholesterol inside of the arteries (atherosclerosis). This is the most common cause. The buildup (plaque) can cause the blood vessels in your heart (coronary arteries) to become narrow or blocked. Plaque can also break off to form a clot. °· A coronary spasm. °· A tearing of the coronary artery (spontaneous coronary artery dissection). °· Low blood pressure (hypotension). °· An abnormal heart beat (arrhythmia). °· Using cocaine or methamphetamine. ° °What increases the risk? °The following factors may make you more likely to develop this condition: °· Age. °· History of chest pain, heart attack, or stroke. °· Being male. °· Family history of chest pain, heart disease, or stroke. °· Smoking. °· Inactivity. °· Being overweight. °· High cholesterol. °· High blood pressure (hypertension). °· Diabetes. °· Excessive alcohol use. ° °What are the signs or symptoms? °Common symptoms of this condition include: °· Chest pain. The pain may last long, or may stop and come back (recur). It may feel like: °? Crushing or squeezing. °? Tightness, pressure, fullness, or heaviness. °· Arm, neck, jaw, or back pain. °· Heartburn or indigestion. °· Shortness of breath. °· Nausea. °· Sudden cold sweats. °· Lightheadedness. °· Dizziness. °· Tiredness (fatigue). ° °Sometimes there are no symptoms. °How is this diagnosed? °This condition may be diagnosed through: °· An electrocardiogram (ECG). This test records the impulses of the heart. °· Blood tests. °· A CT scan of the chest. °· A coronary angiogram. This procedure checks for a blockage in the coronary arteries. ° °How is this treated? °Treatment for this condition may include: °· Oxygen. °· Medicines, such  as: °? Antiplatelet medicines and blood-thinning medicines, such as aspirin. These help prevent blood clots. °? Fibrinolytic therapy. This breaks apart a blood clot. °? Blood pressure medicines. °? Nitroglycerin. °? Pain medicine. °? Cholesterol medicine. °· A procedure called coronary angioplasty and stenting. This is done to widen a narrowed artery and keep it open. °· Coronary artery bypass surgery. This allows blood to pass the blockage to reach your heart. °· Cardiac rehabilitation. This is a program that helps improve your health and well-being. It includes exercise training, education, and counseling to help you recover. ° °Follow these instructions at home: °Eating and drinking °· Follow a heart-healthy, low-salt (sodium) diet. °· Use healthy cooking methods such as roasting, grilling, broiling, baking, poaching, steaming, or stir-frying. °· Talk to a dietitian to learn about healthy cooking methods and how to eat less sodium. °Medicines °· Take over-the-counter and prescription medicines only as told by your health care provider. °· Do not take these medicines unless your health care provider approves: °? Nonsteroidal anti-inflammatory drugs (NSAIDs), such as ibuprofen, naproxen, or celecoxib. °? Vitamin supplements that contain vitamin A or vitamin E. °? Hormone replacement therapy that contains estrogen. °Activity °· Join a cardiac rehabilitation program. °· Ask your health care provider: °? What activities and exercises are safe for you. °? If you should follow specific instructions about lifting, driving, or climbing stairs. °· If you are taking aspirin and another blood thinning medicine, avoid activities that are likely to result in an injury. The medicines can increase your risk of bleeding. °Lifestyle °· Do not use any products that contain nicotine or tobacco, such as cigarettes   and e-cigarettes. If you need help quitting, ask your health care provider.  If you drink alcohol and your health care  provider says it is okay to drink, limit your alcohol intake to no more than 1 drink per day. One drink equals 12 oz of beer, 5 oz of wine, or 1 oz of hard liquor.  Maintain a healthy weight. If you need to lose weight, do it in a way that has been approved by your health care provider. General instructions  Tell all your health care providers about your heart condition, including your dentist. Some medicines can increase your risk of arrhythmia.  Manage other health conditions, such as hypertension and diabetes. These conditions affect your heart.  Learn ways to manage stress.  Get screened for depression, and seek treatment if needed.  Monitor your blood pressure if told by your health care provider.  Keep your vaccinations up to date. Get the annual influenza vaccine.  Keep all follow-up visits as told by your health care provider. This is important. Contact a health care provider if:  You feel overwhelmed or sad.  You have trouble with your daily activities. Get help right away if:  You have pain in your chest, neck, arm, jaw, stomach, or back that recurs, and: ? Lasts more than a few minutes. ? Is not relieved by taking the medicineyour health care provider prescribed.  You have unexplained: ? Heavy sweating. ? Heartburn or indigestion. ? Shortness of breath. ? Difficulty breathing. ? Nausea or vomiting. ? Fatigue. ? Nervousness or anxiety. ? Weakness. ? Diarrhea. ? Dark stools or blood in the stool.  You have sudden lightheadedness or dizziness.  Your blood pressure is higher than 180/120  You faint.  You feel like hurting yourself or think about taking your own life. These symptoms may represent a serious problem that is an emergency. Do not wait to see if the symptoms will go away. Get medical help right away. Call your local emergency services (911 in the [place] Do not drive yourself to the clinic or hospital. Summary  Acute coronary syndrome (ACS) is a  when there is not enough blood and oxygen being supplied to the heart. ACS can result in chest pain or a heart attack.  Acute coronary syndrome is a medical emergency. If you have any symptoms of this condition, get help right away.  Treatment includes oxygen, medicines, and procedures to open the blocked arteries and restore blood flow. This information is not intended to replace advice given to you by your health care provider. Make sure you discuss any questions you have with your health care provider. Document Released: 11/27/2005 Document Revised: 12/29/2016 Document Reviewed: 12/29/2016 Elsevier Interactive Patient Education  2018 [employer]   Acute Coronary Syndrome Acute coronary syndrome (ACS) is a serious problem in which there is suddenly not enough blood and oxygen reaching the heart. ACS can result in chest pain or a heart attack. What are the causes? This condition may be caused by:  A buildup of fat and cholesterol inside of the arteries (atherosclerosis). This is the most common cause. The buildup (plaque) can cause the blood vessels in your heart (coronary arteries) to become narrow or blocked. Plaque can also break off to form a clot.  A coronary spasm.  A tearing of the coronary artery (spontaneous coronary artery dissection).  Low blood pressure (hypotension).  An abnormal heart beat (arrhythmia).  Using cocaine or methamphetamine.  What increases the risk? The following factors may make you  more likely to develop this condition:  Age.  History of chest pain, heart attack, or stroke.  Being male.  Family history of chest pain, heart disease, or stroke.  Smoking.  Inactivity.  Being overweight.  High cholesterol.  High blood pressure (hypertension).  Diabetes.  Excessive alcohol use.  What are the signs or symptoms? Common symptoms of this condition include:  Chest pain. The pain may last long, or may stop and come back (recur). It may  feel like: ? Crushing or squeezing. ? Tightness, pressure, fullness, or heaviness.  Arm, neck, jaw, or back pain.  Heartburn or indigestion.  Shortness of breath.  Nausea.  Sudden cold sweats.  Lightheadedness.  Dizziness.  Tiredness (fatigue).  Sometimes there are no symptoms. How is this diagnosed? This condition may be diagnosed through:  An electrocardiogram (ECG). This test records the impulses of the heart.  Blood tests.  A CT scan of the chest.  A coronary angiogram. This procedure checks for a blockage in the coronary arteries.  How is this treated? Treatment for this condition may include:  Oxygen.  Medicines, such as: ? Antiplatelet medicines and blood-thinning medicines, such as aspirin. These help prevent blood clots. ? Fibrinolytic therapy. This breaks apart a blood clot. ? Blood pressure medicines. ? Nitroglycerin. ? Pain medicine. ? Cholesterol medicine.  A procedure called coronary angioplasty and stenting. This is done to widen a narrowed artery and keep it open.  Coronary artery bypass surgery. This allows blood to pass the blockage to reach your heart.  Cardiac rehabilitation. This is a program that helps improve your health and well-being. It includes exercise training, education, and counseling to help you recover.  Follow these instructions at home: Eating and drinking  Follow a heart-healthy, low-salt (sodium) diet.  Use healthy cooking methods such as roasting, grilling, broiling, baking, poaching, steaming, or stir-frying.  Talk to a dietitian to learn about healthy cooking methods and how to eat less sodium. Medicines  Take over-the-counter and prescription medicines only as told by your health care provider.  Do not take these medicines unless your health care provider approves: ? Nonsteroidal anti-inflammatory drugs (NSAIDs), such as ibuprofen, naproxen, or celecoxib. ? Vitamin supplements that contain vitamin A or vitamin  E. ? Hormone replacement therapy that contains estrogen. Activity  Join a cardiac rehabilitation program.  Ask your health care provider: ? What activities and exercises are safe for you. ? If you should follow specific instructions about lifting, driving, or climbing stairs.  If you are taking aspirin and another blood thinning medicine, avoid activities that are likely to result in an injury. The medicines can increase your risk of bleeding. Lifestyle  Do not use any products that contain nicotine or tobacco, such as cigarettes and e-cigarettes. If you need help quitting, ask your health care provider.  If you drink alcohol and your health care provider says it is okay to drink, limit your alcohol intake to no more than 1 drink per day. One drink equals 12 oz of beer, 5 oz of wine, or 1 oz of hard liquor.  Maintain a healthy weight. If you need to lose weight, do it in a way that has been approved by your health care provider. General instructions  Tell all your health care providers about your heart condition, including your dentist. Some medicines can increase your risk of arrhythmia.  Manage other health conditions, such as hypertension and diabetes. These conditions affect your heart.  Learn ways to manage stress.  Get screened  for depression, and seek treatment if needed.  Monitor your blood pressure if told by your health care provider.  Keep your vaccinations up to date. Get the annual influenza vaccine.  Keep all follow-up visits as told by your health care provider. This is important. Contact a health care provider if:  You feel overwhelmed or sad.  You have trouble with your daily activities. Get help right away if:  You have pain in your chest, neck, arm, jaw, stomach, or back that recurs, and: ? Lasts more than a few minutes. ? Is not relieved by taking the medicineyour health care provider prescribed.  You have unexplained: ? Heavy sweating. ? Heartburn  or indigestion. ? Shortness of breath. ? Difficulty breathing. ? Nausea or vomiting. ? Fatigue. ? Nervousness or anxiety. ? Weakness. ? Diarrhea. ? Dark stools or blood in the stool.  You have sudden lightheadedness or dizziness.  Your blood pressure is higher than 180/120  You faint.  You feel like hurting yourself or think about taking your own life. These symptoms may represent a serious problem that is an emergency. Do not wait to see if the symptoms will go away. Get medical help right away. Call your local emergency services (911 in the [place] Do not drive yourself to the clinic or hospital. Summary  Acute coronary syndrome (ACS) is a when there is not enough blood and oxygen being supplied to the heart. ACS can result in chest pain or a heart attack.  Acute coronary syndrome is a medical emergency. If you have any symptoms of this condition, get help right away.  Treatment includes oxygen, medicines, and procedures to open the blocked arteries and restore blood flow. This information is not intended to replace advice given to you by your health care provider. Make sure you discuss any questions you have with your health care provider. Document Released: 11/27/2005 Document Revised: 12/29/2016 Document Reviewed: 12/29/2016 Elsevier Interactive Patient Education  [employer].

## 2018-10-22 NOTE — Progress Notes (Signed)
Patient ambulated in hall with this RN.  No c/o CP or SOB.

## 2018-10-22 NOTE — Discharge Summary (Signed)
[facility] - [place] at [facility]   PATIENT NAME: Chad Henderson    MR#:  [id number]  DATE OF BIRTH:  17-Feb-1956  DATE OF ADMISSION:  10/19/2018 ADMITTING PHYSICIAN: Altamese Dilling, MD  DATE OF DISCHARGE: 10/22/2018 11:38 AM  PRIMARY CARE PHYSICIAN: Marisue Ivan, MD    ADMISSION DIAGNOSIS:  NSTEMI (non-ST elevated myocardial infarction) (HCC) [I21.4]  DISCHARGE DIAGNOSIS:  Principal Problem:   Ischemic chest pain [facility]) Active Problems:   Chest pain   NSTEMI (non-ST elevated myocardial infarction) (HCC)   SECONDARY DIAGNOSIS:   Past Medical History:  Diagnosis Date  . Medical history non-contributory   . Seasonal allergies     HOSPITAL COURSE:   1.  NSTEMI.  The patient was initially treated with heparin drip, Coreg aspirin and Lipitor.  The patient had a proximal LAD lesion 95% stenosis which was stented on 10/21/2018 by Dr. Evette Georges.  Brilinta was added.  Care manager gave Brilinta discount card.  1 month supply of medications were prescribed to ensure patient follow-up.  Follow-up cardiac rehab and cardiology as outpatient. 2.  Hyperlipidemia unspecified.  LDL 171.  Goal less than 100.  Lipitor 80 mg prescribed 3.  Hypertension low-dose on Coreg  DISCHARGE CONDITIONS:   Satisfactory  CONSULTS OBTAINED:  Treatment Team:  Marcina Millard, MD  DRUG ALLERGIES:  No Known Allergies  DISCHARGE MEDICATIONS:   Allergies as of 10/22/2018   No Known Allergies     Medication List    TAKE these medications   aspirin 81 MG EC tablet Take 1 tablet (81 mg total) by mouth daily.   atorvastatin 80 MG tablet Commonly known as:  LIPITOR Take 1 tablet (80 mg total) by mouth daily at 6 PM.   carvedilol 3.125 MG tablet Commonly known as:  COREG Take 1 tablet (3.125 mg total) by mouth 2 (two) times daily with a meal.   cetirizine 10 MG tablet Commonly known as:  ZYRTEC Take 10 mg by mouth daily.   multivitamin with minerals  Tabs tablet Take 1 tablet by mouth daily.   nitroGLYCERIN 0.4 MG SL tablet Commonly known as:  NITROSTAT Place 1 tablet (0.4 mg total) under the tongue every 5 (five) minutes as needed for chest pain.   ticagrelor 90 MG Tabs tablet Commonly known as:  BRILINTA Take 1 tablet (90 mg total) by mouth 2 (two) times daily.        DISCHARGE INSTRUCTIONS:   Follow-up PMD 5 days Follow-up Dr. Evette Georges cardiology 1 week Follow-up cardiac rehab  If you experience worsening of your admission symptoms, develop shortness of breath, life threatening emergency, suicidal or homicidal thoughts you must seek medical attention immediately by calling 911 or calling your MD immediately  if symptoms less severe.  You Must read complete instructions/literature along with all the possible adverse reactions/side effects for all the Medicines you take and that have been prescribed to you. Take any new Medicines after you have completely understood and accept all the possible adverse reactions/side effects.   Please note  You were cared for by a hospitalist during your hospital stay. If you have any questions about your discharge medications or the care you received while you were in the hospital after you are discharged, you can call the unit and asked to speak with the hospitalist on call if the hospitalist that took care of you is not available. Once you are discharged, your primary care physician will handle any further medical issues. Please note that NO REFILLS for any  discharge medications will be authorized once you are discharged, as it is imperative that you return to your primary care physician (or establish a relationship with a primary care physician if you do not have one) for your aftercare needs so that they can reassess your need for medications and monitor your lab values.    Today   CHIEF COMPLAINT:   Chief Complaint  Patient presents with  . Chest Pain    HISTORY OF PRESENT ILLNESS:   Arvil PersonsRichard Stueve  is a 62 y.o. male presented with chest pain and found to have an elevated troponin   VITAL SIGNS:  Blood pressure 110/70, pulse 65, temperature 98 F (36.7 C), temperature source Oral, resp. rate 18, height 5\' 10"  (1.778 m), weight 90.3 kg, SpO2 100 %.   PHYSICAL EXAMINATION:  GENERAL:  62 y.o.-year-old patient lying in the bed with no acute distress.  EYES: Pupils equal, round, reactive to light and accommodation. No scleral icterus. Extraocular muscles intact.  HEENT: Head atraumatic, normocephalic. Oropharynx and nasopharynx clear.  NECK:  Supple, no jugular venous distention. No thyroid enlargement, no tenderness.  LUNGS: Normal breath sounds bilaterally, no wheezing, rales,rhonchi or crepitation. No use of accessory muscles of respiration.  CARDIOVASCULAR: S1, S2 normal. No murmurs, rubs, or gallops.  ABDOMEN: Soft, non-tender, non-distended. Bowel sounds present. No organomegaly or mass.  EXTREMITIES: No pedal edema, cyanosis, or clubbing.  NEUROLOGIC: Cranial nerves II through XII are intact. Muscle strength 5/5 in all extremities. Sensation intact. Gait not checked.  PSYCHIATRIC: The patient is alert and oriented x 3.  SKIN: No obvious rash, lesion, or ulcer.   DATA REVIEW:   CBC Recent Labs  Lab 10/21/18 0422  WBC 6.2  HGB 15.5  HCT 44.5  PLT 233    Chemistries  Recent Labs  Lab 10/20/18 0204  NA 135  K 3.7  CL 102  CO2 25  GLUCOSE 112*  BUN 10  CREATININE 0.92  CALCIUM 8.8*    Cardiac Enzymes Recent Labs  Lab 10/20/18 0204  TROPONINI 0.51*     Management plans discussed with the patient, and he is in agreement.  Spoke with family at length yesterday.  CODE STATUS:     Code Status Orders  (From admission, onward)         Start     Ordered   10/19/18 1450  Full code  Continuous     10/19/18 1449        Code Status History    This patient has a current code status but no historical code status.      TOTAL TIME  TAKING CARE OF THIS PATIENT: 35 minutes.    Alford Highlandichard Tytus Strahle M.D on 10/22/2018 at 1:57 PM  Between 7am to 6pm - Pager - [phone]  After 6pm go to [email or web address] - password [email or web address]  [facility] Office  [phone]  CC: Primary care physician; Marisue IvanLinthavong, [place], MD

## 2018-10-22 NOTE — Progress Notes (Signed)
[facility]  SUBJECTIVE: Patient sitting in chair, working on computer, denies chest pain or shortness of breath   Vitals:   10/21/18 1702 10/21/18 1704 10/21/18 2045 10/22/18 0602  BP: 130/67 130/67 130/76 134/77  Pulse: (!) 56 (!) 56 63 61  Resp:   18 18  Temp:   98.5 F (36.9 C) 98.4 F (36.9 C)  TempSrc:   Oral Oral  SpO2:  98% 97% 97%  Weight:      Height:         Intake/Output Summary (Last 24 hours) at 10/22/2018 0826 Last data filed at 10/22/2018 0641 Gross per 24 hour  Intake 91.16 ml  Output 575 ml  Net -483.84 ml      PHYSICAL EXAM  General: Well developed, well nourished, in no acute distress HEENT:  Normocephalic and atramatic Neck:  No JVD.  Lungs: Clear bilaterally to auscultation and percussion. Heart: HRRR . Normal S1 and S2 without gallops or murmurs.  Abdomen: Bowel sounds are positive, abdomen soft and non-tender  Msk:  Back normal, normal gait. Normal strength and tone for age. Extremities: No clubbing, cyanosis or edema.   Neuro: Alert and oriented X 3. Psych:  Good affect, responds appropriately   LABS: Basic Metabolic Panel: Recent Labs    10/19/18 1157 10/20/18 0204  NA 139 135  K 3.9 3.7  CL 103 102  CO2 30 25  GLUCOSE 166* 112*  BUN 11 10  CREATININE 1.07 0.92  CALCIUM 9.2 8.8*   Liver Function Tests: No results for input(s): AST, ALT, ALKPHOS, BILITOT, PROT, ALBUMIN in the last 72 hours. No results for input(s): LIPASE, AMYLASE in the last 72 hours. CBC: Recent Labs    10/20/18 0204 10/21/18 0422  WBC 7.4 6.2  HGB 15.4 15.5  HCT 44.2 44.5  MCV 83.6 83.2  PLT 228 233   Cardiac Enzymes: Recent Labs    10/19/18 1905 10/19/18 2349 10/20/18 0204  TROPONINI 0.68* 0.60* 0.51*   BNP: Invalid input(s): POCBNP D-Dimer: No results for input(s): DDIMER in the last 72 hours. Hemoglobin A1C: Recent Labs    10/19/18 1456  HGBA1C 5.3   Fasting Lipid Panel: Recent Labs    10/19/18 1456  CHOL 216*  HDL 31*   LDLCALC 171*  TRIG 71  CHOLHDL 7.0   Thyroid Function Tests: No results for input(s): TSH, T4TOTAL, T3FREE, THYROIDAB in the last 72 hours.  Invalid input(s): FREET3 Anemia Panel: No results for input(s): VITAMINB12, FOLATE, FERRITIN, TIBC, IRON, RETICCTPCT in the last 72 hours.  No results found.   Echo pending  TELEMETRY: Normal sinus rhythm:  ASSESSMENT AND PLAN:  Principal Problem:   Ischemic chest pain (HCC) Active Problems:   Chest pain   NSTEMI (non-ST elevated myocardial infarction) (HCC)    1. NSTEMI, status post DES mid LAD, doing well, without chest pain ambulating without difficulty  Recommendations  1.  Agree with current therapy 2.  Dual antiplatelet therapy uninterrupted for 1 year 3.  May discharge home, follow-up in 1 week   Marcina Millard, MD, PhD, [facility] 10/22/2018 8:26 AM

## 2019-06-29 IMAGING — CR DG CHEST 2V
1 series · 2 of 2 positions shown · non-contrast
Comparison: None.

CLINICAL DATA: Two days of chest pain.

EXAM:
CHEST - 2 VIEW

[Series 1: w chest pa · 0.14mm/px · 2 of 2 slices shown]
[im 1/2]
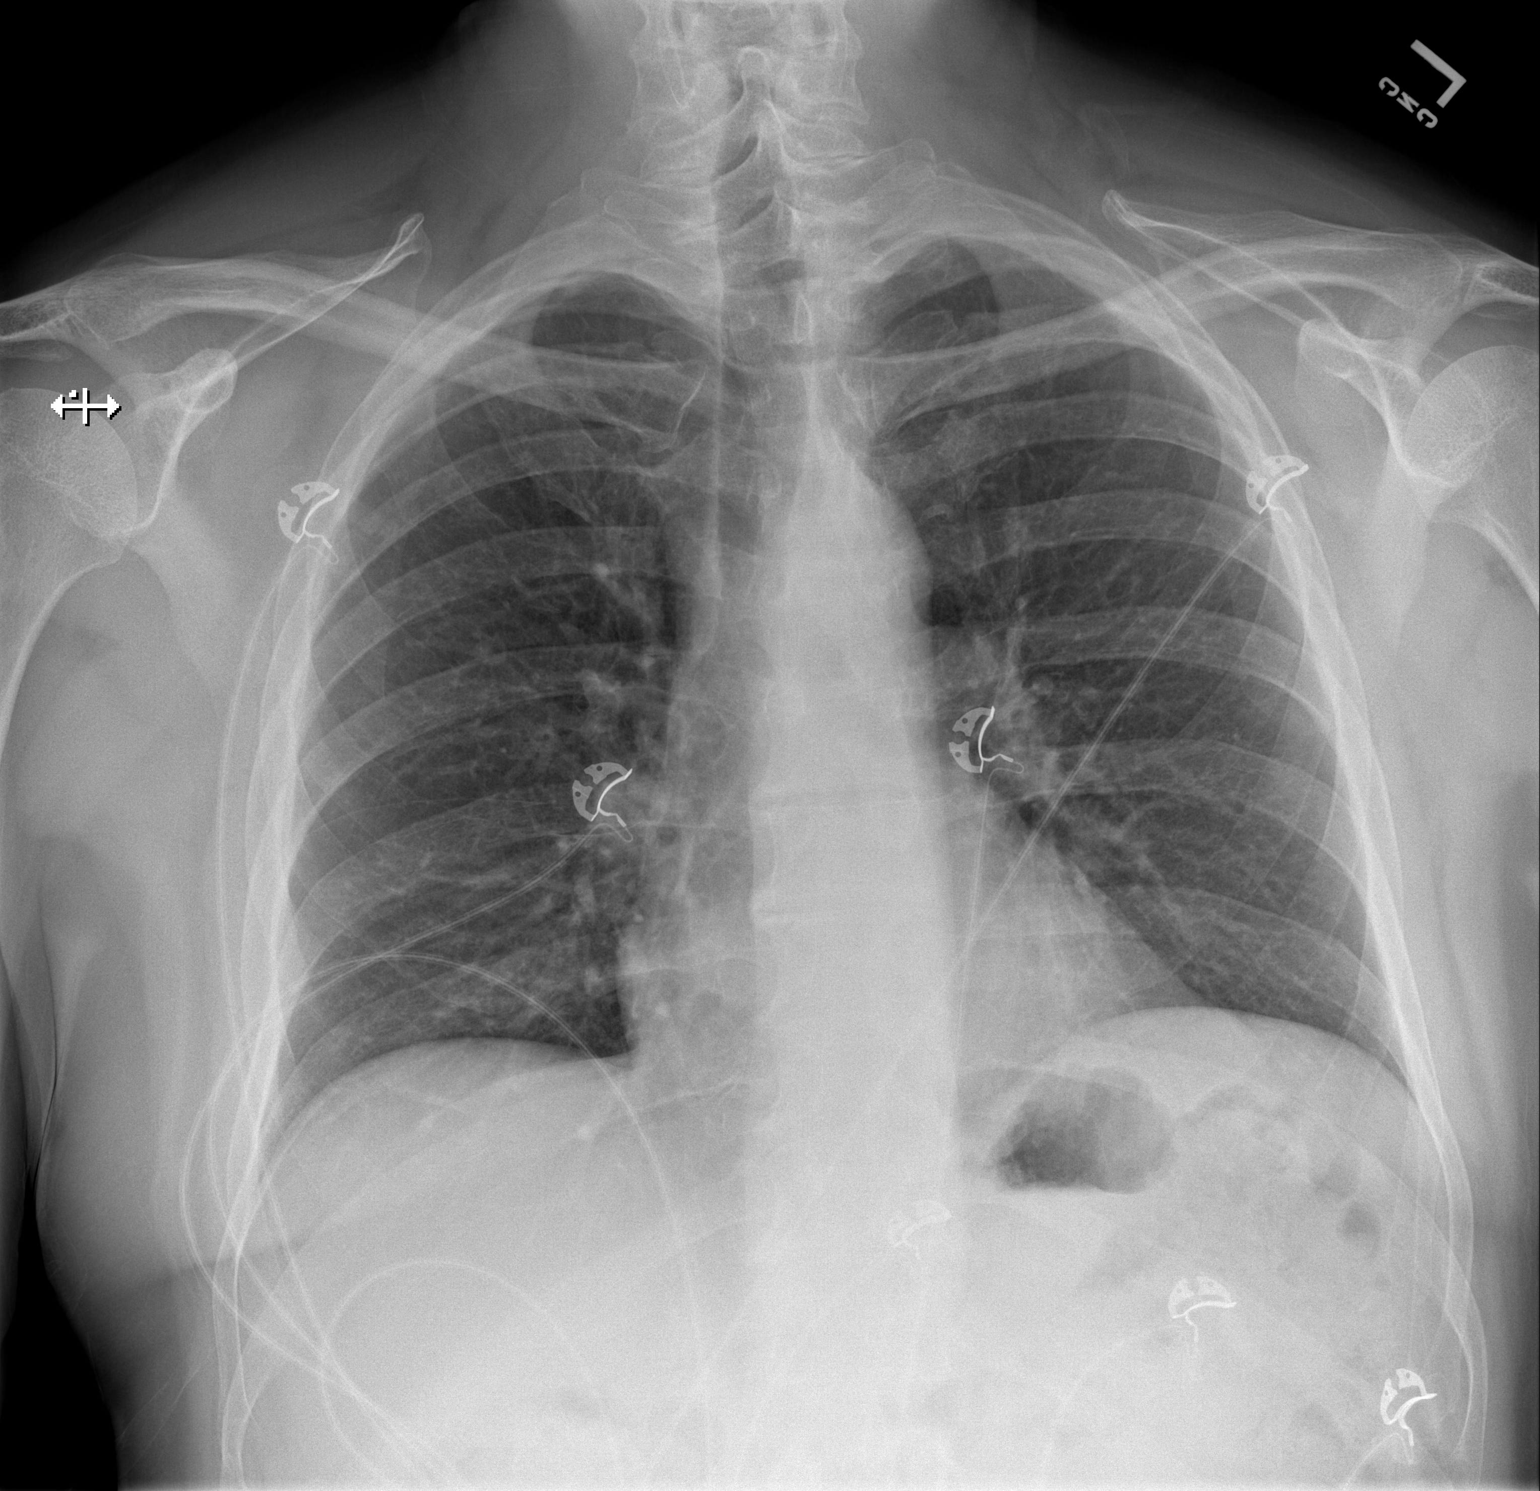
[im 2/2]
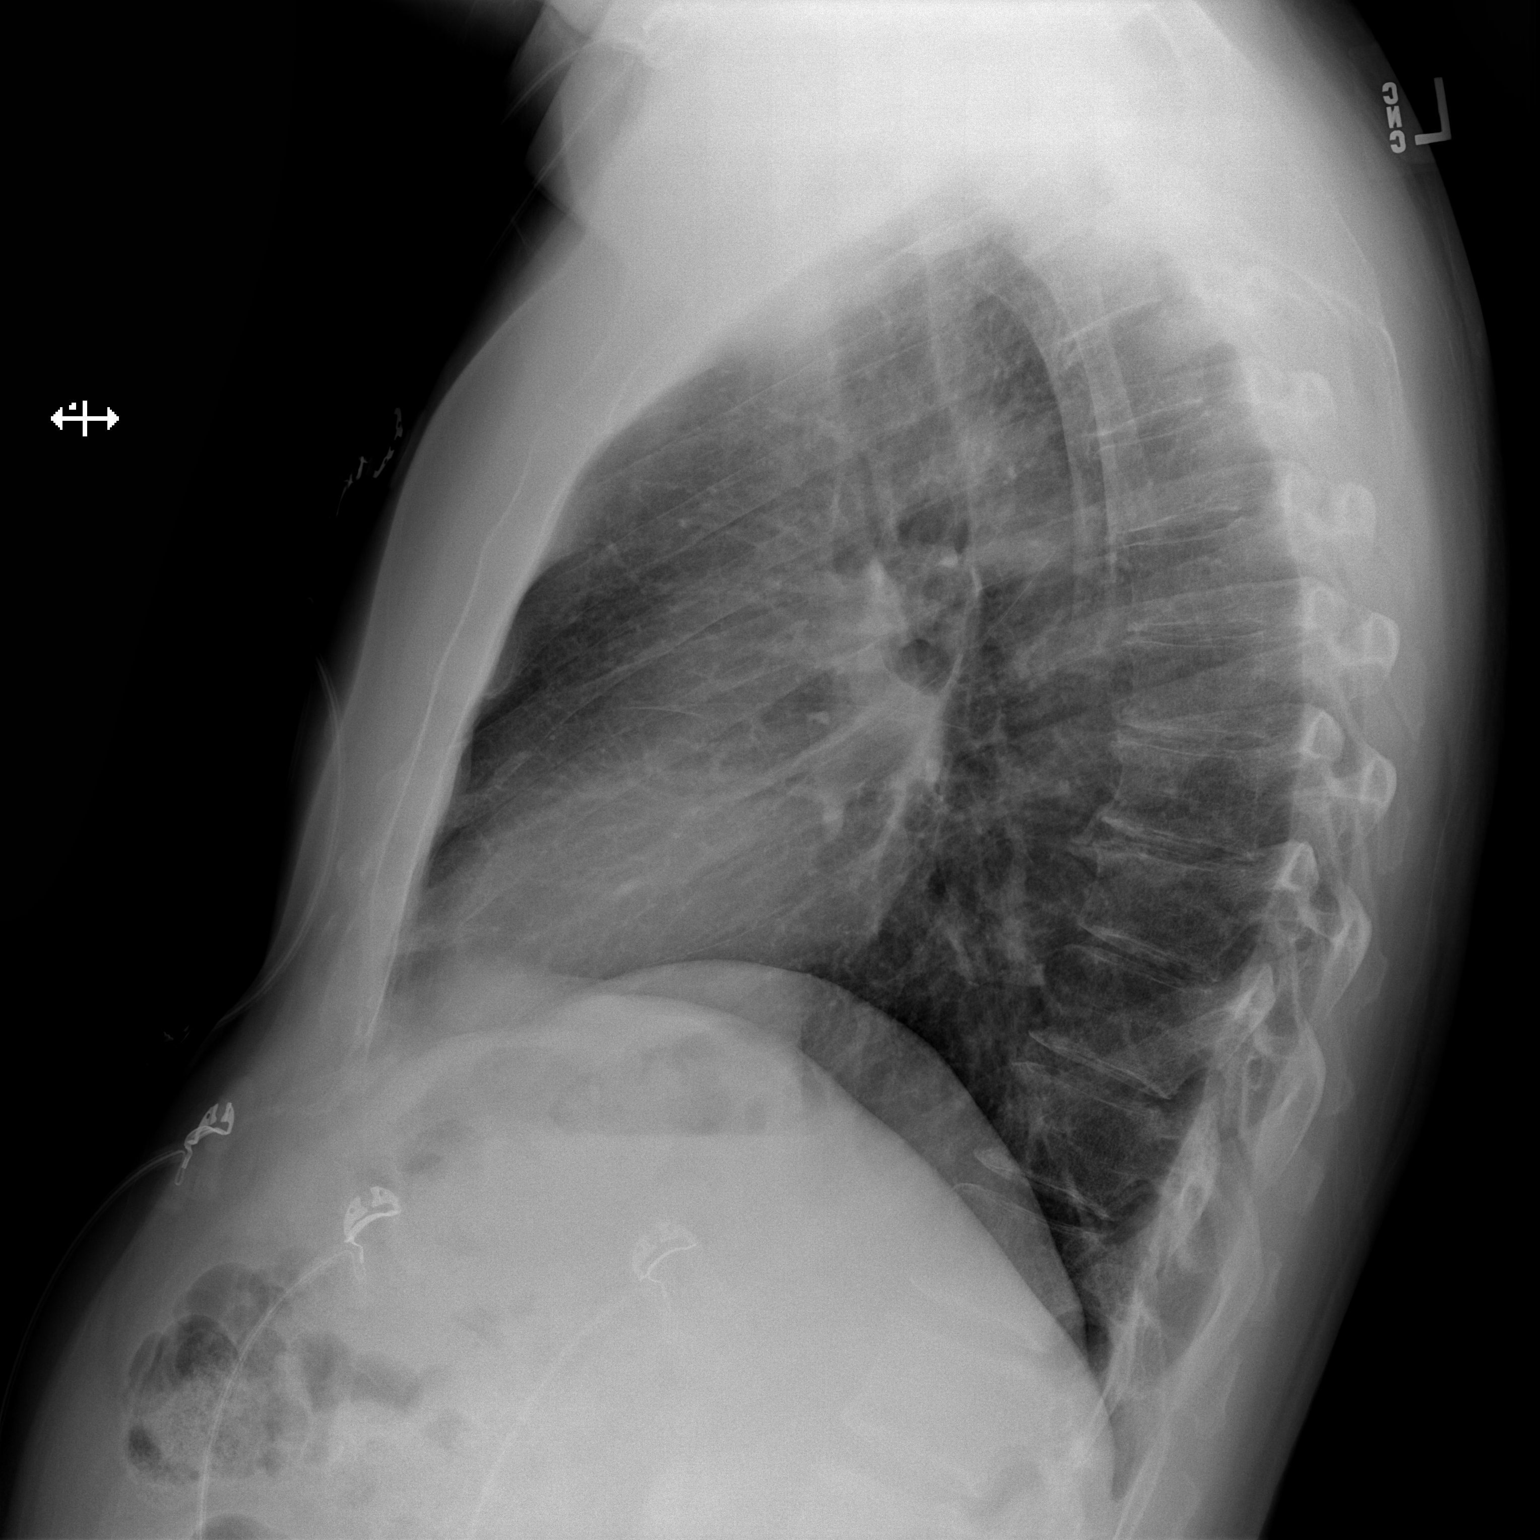

[2 of 2 positions shown; findings below may reference images not displayed]

FINDINGS: Lungs are adequately inflated without consolidation or effusion.
Cardiomediastinal silhouette is within normal. Minimal degenerative
change of the spine.
IMPRESSION: No active cardiopulmonary disease.

## 2019-07-25 ENCOUNTER — Other Ambulatory Visit: Payer: Self-pay | Admitting: Family Medicine

## 2019-07-25 DIAGNOSIS — R1011 Right upper quadrant pain: Secondary | ICD-10-CM

## 2019-07-28 ENCOUNTER — Other Ambulatory Visit: Payer: Self-pay

## 2019-07-28 ENCOUNTER — Ambulatory Visit
Admission: RE | Admit: 2019-07-28 | Discharge: 2019-07-28 | Disposition: A | Discharge status: Home / Self Care | Payer: Managed Care, Other (non HMO) | Source: Ambulatory Visit | Attending: Family Medicine | Admitting: Family Medicine

## 2019-07-28 DIAGNOSIS — R1011 Right upper quadrant pain: Secondary | ICD-10-CM | POA: Diagnosis present

## 2019-07-29 ENCOUNTER — Ambulatory Visit: Payer: Self-pay | Admitting: Surgery

## 2019-07-29 NOTE — H&P (Signed)
Subjective:   CC: Chronic cholecystitis [K81.1]  HPI:  Chad Henderson is a 63 y.o. male who was referred by Dion Body, MD for evaluation of above CC. Symptoms were first noted 3 weeks ago. Pain is achy and intermittent, confined to the right upper quadrant, without radiation.  Associated with N/V during first episode three weeks ago, exacerbated by nothing specfic.  Pain resolved spontaneously at that time, but recurred four days ago, and still occasionally sore.  Not requiring any pain meds at this time   Past Medical History:  has a past medical history of H/O heart artery stent, Hyperlipidemia, and Hypertension.  Past Surgical History:  has a past surgical history that includes back surgery (1989).  Family History: family history includes Breast cancer in his maternal grandmother; Cancer in his maternal aunt; Lung cancer in his mother.  Social History:  reports that he has never smoked. He has never used smokeless tobacco. He reports that he does not drink alcohol or use drugs.  Current Medications: has a current medication list which includes the following prescription(s): aspirin, atorvastatin, carvedilol, cetirizine, multivitamin with iron-minerals, nitroglycerin, and ticagrelor.  Allergies:     Allergies as of 07/28/2019  . (No Known Allergies)    ROS:  A 15 point review of systems was performed and pertinent positives and negatives noted in HPI    Objective:     BP 118/79   Pulse 84   Ht 177.8 cm (5\' 10" )   Wt 93.9 kg (207 lb)   BMI 29.70 kg/m    Constitutional :  alert, appears stated age, cooperative and no distress  Lymphatics/Throat:  no asymmetry, masses, or scars  Respiratory:  clear to auscultation bilaterally  Cardiovascular:  regular rate and rhythm  Gastrointestinal: soft, no guarding, minimal TTP RUQ.    Musculoskeletal: Steady gait and movement  Skin: Cool and moist  Psychiatric: Normal affect, non-agitated, not confused        LABS:  n/a   RADS: CLINICAL DATA: Right upper quadrant abdominal pain for 4 weeks  EXAM: ULTRASOUND ABDOMEN LIMITED RIGHT UPPER QUADRANT  COMPARISON: None.  FINDINGS: Gallbladder:  Multiple echogenic, shadowing stones layer dependently within the gallbladder lumen. Gallbladder wall is mildly thickened with pericholecystic edema. Sonographer reports a negative sonographic Murphy sign.  Common bile duct:  Diameter: 4 mm  Liver:  No focal lesion identified. Mildly increased parenchymal echogenicity. Portal vein is patent on color Doppler imaging with normal direction of blood flow towards the liver.  Other: None.  IMPRESSION: 1. Cholelithiasis with mild diffuse gallbladder wall thickening and subtle pericholecystic edema. Findings raises suspicion for early acute cholecystitis. Consider nuclear medicine hepatobiliary scan for further evaluation, if clinically warranted. 2. The echogenicity of the liver is mildly increased. This is a nonspecific finding but is most commonly seen with fatty infiltration of the liver. There are no obvious focal liver lesions.  These results will be called to the ordering clinician or representative by the Radiologist Assistant, and communication documented in the PACS or zVision Dashboard.   Electronically Signed  By: Davina Poke M.D.  On: 07/28/2019 11:50  Other Result Information  Interface, Rad Results In - 07/28/2019 11:52 AM EDT CLINICAL DATA:  Right upper quadrant abdominal pain for 4 weeks  EXAM: ULTRASOUND ABDOMEN LIMITED RIGHT UPPER QUADRANT  COMPARISON:  None.  FINDINGS: Gallbladder:  Multiple echogenic, shadowing stones layer dependently within the gallbladder lumen. Gallbladder wall is mildly thickened with pericholecystic edema. Sonographer reports a negative sonographic Murphy sign.  Common bile  duct:  Diameter: 4 mm  Liver:  No focal lesion identified. Mildly increased  parenchymal echogenicity. Portal vein is patent on color Doppler imaging with normal direction of blood flow towards the liver.  Other: None.  IMPRESSION: 1. Cholelithiasis with mild diffuse gallbladder wall thickening and subtle pericholecystic edema. Findings raises suspicion for early acute cholecystitis. Consider nuclear medicine hepatobiliary scan for further evaluation, if clinically warranted. 2. The echogenicity of the liver is mildly increased. This is a nonspecific finding but is most commonly seen with fatty infiltration of the liver. There are no obvious focal liver lesions.  These results will be called to the ordering clinician or representative by the Radiologist Assistant, and communication documented in the PACS or zVision Dashboard.   Electronically Signed   By: Duanne GuessNicholas  Plundo M.D.   On: 07/28/2019 11:50    Assessment:      Chronic cholecystitis [K81.1]  NSTEMI on ticagrilor  Plan:     1. Chronic cholecystitis [K81.1] Discussed the risk of surgery including post-op infxn, seroma, biloma, chronic pain, poor-delayed wound healing, retained gallstone, conversion to open procedure, post-op SBO or ileus, and need for additional procedures to address said risks.  The risks of general anesthetic including MI, CVA, sudden death or even reaction to anesthetic medications also discussed. Alternatives include continued observation.  Benefits include possible symptom relief, prevention of complications including acute cholecystitis, pancreatitis.  Typical post operative recovery of 3-5 days rest, continued pain in area and incision sites, possible loose stools up to 4-6 weeks, also discussed.  ED return precautions given for sudden increase in RUQ pain, with possible accompanying fever, nausea, and/or vomiting.  The patient understands the risks, any and all questions were answered to the patient's satisfaction.  2. Patient has elected to proceed with  surgical treatment. Procedure will be scheduled.  Written consent was obtained. ROBOTIC-assisted after cardiology ok's holding ticagrilor.  Slight increased risk from perioperative complications due to hx of NSTEMI stent placement

## 2019-07-31 ENCOUNTER — Inpatient Hospital Stay: Admission: RE | Admit: 2019-07-31 | Payer: Managed Care, Other (non HMO) | Source: Ambulatory Visit

## 2019-08-05 ENCOUNTER — Other Ambulatory Visit: Payer: Managed Care, Other (non HMO)

## 2019-08-08 ENCOUNTER — Encounter: Admission: RE | Payer: Self-pay | Source: Home / Self Care

## 2019-08-08 ENCOUNTER — Ambulatory Visit: Admission: RE | Admit: 2019-08-08 | Payer: Managed Care, Other (non HMO) | Source: Home / Self Care | Admitting: Surgery

## 2019-08-08 SURGERY — CHOLECYSTECTOMY, ROBOT-ASSISTED, LAPAROSCOPIC
Anesthesia: General

## 2021-09-20 ENCOUNTER — Encounter: Payer: Self-pay | Admitting: Emergency Medicine

## 2021-09-20 ENCOUNTER — Other Ambulatory Visit: Payer: Self-pay

## 2021-09-20 ENCOUNTER — Ambulatory Visit
Admission: EM | Admit: 2021-09-20 | Discharge: 2021-09-20 | Disposition: A | Discharge status: Home / Self Care | Payer: Managed Care, Other (non HMO) | Attending: Emergency Medicine | Admitting: Emergency Medicine

## 2021-09-20 DIAGNOSIS — J019 Acute sinusitis, unspecified: Secondary | ICD-10-CM

## 2021-09-20 DIAGNOSIS — J309 Allergic rhinitis, unspecified: Secondary | ICD-10-CM

## 2021-09-20 MED ORDER — AMOXICILLIN-POT CLAVULANATE 875-125 MG PO TABS: 1.0000 | ORAL_TABLET | Freq: Two times a day (BID) | ORAL | 0 refills | Status: AC

## 2021-09-20 NOTE — ED Provider Notes (Signed)
HPI  SUBJECTIVE:  Chad Henderson is a 65 y.o. male who presents with 2 days of sinus drainage/postnasal drip, nasal congestion, clear rhinorrhea, mild sore throat described as feeling "tight" and an occasional cough to clear postnasal drip.  He reports allergy symptoms of itchy eyes and sneezing.  He just came back from a trip to [place].  No fevers, body aches, headaches, sinus pain or pressure, facial swelling, upper dental pain, wheezing, shortness of breath.  No known COVID exposure.  He got the second dose of the COVID-vaccine.  No antibiotics in the past month.  No antipyretic in the past 6 hours.  This has happens several times a year for a long time.  He states that this usually "infects his throat" and that antibiotics help it clear faster.  He has tried Zyrtec with marginal improvement in his symptoms.  No aggravating factors.  He has a past medical history of NSTEMI status post stent.  He is on Brilinta.  He has a history of allergic rhinitis and sinusitis once or twice a year.  PMD: [facility].  Past Medical History:  Diagnosis Date   Medical history non-contributory    Seasonal allergies     Past Surgical History:  Procedure Laterality Date   BACK SURGERY     CORONARY STENT INTERVENTION N/A 10/21/2018   Procedure: CORONARY STENT INTERVENTION;  Surgeon: Marcina Millard, MD;  Location: [facility] INVASIVE CV LAB;  Service: Cardiovascular;  Laterality: N/A;   LEFT HEART CATH Left 10/21/2018   Procedure: Left Heart Cath with Coronary Angiography and possible PCI;  Surgeon: Marcina Millard, MD;  Location: [facility] INVASIVE CV LAB;  Service: Cardiovascular;  Laterality: Left;    Family History  Problem Relation Age of Onset   Lung cancer Mother     Social History   Tobacco Use   Smoking status: Never   Smokeless tobacco: Never  Vaping Use   Vaping Use: Never used  Substance Use Topics   Alcohol use: Yes    Comment: occasionally   Drug use: Never    No  current facility-administered medications for this encounter.  Current Outpatient Medications:    amoxicillin-clavulanate (AUGMENTIN) 875-125 MG tablet, Take 1 tablet by mouth 2 (two) times daily. X 7 days, Disp: 14 tablet, Rfl: 0   aspirin EC 81 MG EC tablet, Take 1 tablet (81 mg total) by mouth daily., Disp: 30 tablet, Rfl: 0   atorvastatin (LIPITOR) 80 MG tablet, Take 1 tablet (80 mg total) by mouth daily at 6 PM., Disp: 30 tablet, Rfl: 0   carvedilol (COREG) 3.125 MG tablet, Take 1 tablet (3.125 mg total) by mouth 2 (two) times daily with a meal., Disp: 60 tablet, Rfl: 0   Multiple Vitamin (MULTIVITAMIN WITH MINERALS) TABS tablet, Take 1 tablet by mouth daily., Disp: , Rfl:    nitroGLYCERIN (NITROSTAT) 0.4 MG SL tablet, Place 1 tablet (0.4 mg total) under the tongue every 5 (five) minutes as needed for chest pain., Disp: 30 tablet, Rfl: 0   ticagrelor (BRILINTA) 90 MG TABS tablet, Take 1 tablet (90 mg total) by mouth 2 (two) times daily., Disp: 60 tablet, Rfl: 0  No Known Allergies   ROS  As noted in HPI.   Physical Exam  BP (!) 168/80 (BP Location: Left Arm)   Pulse 63   Temp 97.9 F (36.6 C) (Oral)   Resp 18   SpO2 98%   Constitutional: Well developed, well nourished, no acute distress Eyes:  EOMI, conjunctiva normal bilaterally HENT:  Normocephalic, atraumatic,mucus membranes moist.  Positive nasal congestion.  Erythematous, swollen turbinates.  No maxillary, frontal sinus tenderness.  No obvious postnasal drip.  Normal voice.  No drooling, trismus, stridor. Respiratory: Normal inspiratory effort lungs clear bilaterally Cardiovascular: Normal rate regular rhythm no murmurs, rubs, gallops. GI: nondistended skin: No rash, skin intact Musculoskeletal: no deformities Neurologic: Alert & oriented x 3, no focal neuro deficits Psychiatric: Speech and behavior appropriate   ED Course   Medications - No data to display  No orders of the defined types were placed in this  encounter.   No results found for this or any previous visit (from the past 24 hour(s)). No results found.  ED Clinical Impression  1. Allergic rhinitis, unspecified seasonality, unspecified trigger   2. Acute sinusitis, recurrence not specified, unspecified location      ED Assessment/Plan  Patient declined COVID testing.  This sounds to be allergy driven, so will have him discontinue the Zyrtec since it is not working as well as it usually does and try Allegra instead.  He is to restart Flonase and saline nasal irrigation.  Wait-and-see prescription of Augmentin for sinus infection.   Discussed MDM, treatment plan, and plan for follow-up with patient.. patient agrees with plan.   Meds ordered this encounter  Medications   amoxicillin-clavulanate (AUGMENTIN) 875-125 MG tablet    Sig: Take 1 tablet by mouth 2 (two) times daily. X 7 days    Dispense:  14 tablet    Refill:  0       *This clinic note was created using Scientist, clinical (histocompatibility and immunogenetics). Therefore, there may be occasional mistakes despite careful proofreading.  ?    Domenick Gong, MD 09/21/21 [phone]

## 2021-09-20 NOTE — ED Triage Notes (Signed)
Pt c/o nasal drainage x 2 days that is starting to irritate his throat.

## 2021-09-20 NOTE — Discharge Instructions (Signed)
discontinue the Zyrtec since it is not working and try Allegra instead.  restart Flonase and saline nasal irrigation with a Lloyd Huger Med rinse and distilled water as often as you want.  Finish the Augmentin, even if you feel better

## 2023-01-08 ENCOUNTER — Other Ambulatory Visit: Payer: Self-pay | Admitting: Family Medicine

## 2023-01-08 DIAGNOSIS — R7401 Elevation of levels of liver transaminase levels: Secondary | ICD-10-CM

## 2023-01-10 ENCOUNTER — Ambulatory Visit
Admission: RE | Admit: 2023-01-10 | Discharge: 2023-01-10 | Disposition: A | Discharge status: Home / Self Care | Payer: Managed Care, Other (non HMO) | Source: Ambulatory Visit | Attending: Family Medicine | Admitting: Family Medicine

## 2023-01-10 DIAGNOSIS — R7401 Elevation of levels of liver transaminase levels: Secondary | ICD-10-CM | POA: Diagnosis present

## 2024-07-25 NOTE — Progress Notes (Signed)
 Chief Complaint  Patient presents with  . Follow-up    HPI  Chad Henderson is a 68 y.o. here for follow up of chronic medical issues  HLD: No acute issues.  Tolerating medications without adverse effects.  Denies any myalgia.  No cp or CVA symptoms.   Borderline DM: No acute issues.  No meds.  Asymptomatic.  Not checking CBGs.  Fatty liver disease: No issues.  Trying to modify nutrition.    BPH: Weaker stream and nocuturia.  NO hematuria or dysuria.    ROS Review of systems is unremarkable for any active cardiac, respiratory, GI, GU, hematologic, neurologic, dermatologic, HEENT, or psychiatric symptoms except as noted above.  No fevers, chills, or constitutional symptoms.   Current Outpatient Medications  Medication Sig Dispense Refill  . aspirin  81 MG EC tablet Take 1 tablet (81 mg total) by mouth once daily 90 tablet 1  . atorvastatin  (LIPITOR ) 80 MG tablet TAKE 1 TABLET (80 MG TOTAL) BY MOUTH ONCE DAILY. 100 tablet 1  . cetirizine (ZYRTEC) 10 MG tablet Take 10 mg by mouth once daily       . multivitamin with iron-minerals (SUPER THERA VITE M) tablet Take 1 tablet by mouth once daily      . nitroGLYcerin  (NITROSTAT ) 0.4 MG SL tablet Place 0.4 mg under the tongue every 5 (five) minutes as needed        No current facility-administered medications for this visit.    Allergies as of 07/25/2024  . (No Known Allergies)    Patient Active Problem List  Diagnosis  . Pure hypercholesterolemia (LDL 71 - 07/11/24)  . History of non-ST elevation myocardial infarction (10/2018) - followed by Dr. Ammon  . CAD S/P percutaneous coronary angioplasty (10/2018) - followed by Dr. Ammon  . 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 Fatty liver disease, nonalcoholic    Past Medical History:  Diagnosis Date  . H/O heart artery stent   . Hyperlipidemia   . Hypertension     Past Surgical History:  Procedure Laterality Date  . back surgery  1989   L3  rupture disc repair  . COLONOSCOPY  11/28/2022   [facility]/Rpt71yrs/TKT    Vitals:   07/25/24 1103 07/25/24 1108  BP: (!) 162/86 (!) 150/82  Pulse: 63   SpO2: 99%   Weight: 93.9 kg (207 lb)   Height: 177.8 cm (5' 10)   PainSc: 0-No pain    Body mass index is 29.7 kg/m.  Exam  General. Well appearing; NAD; VS reviewed     Eyes. Sclera and conjunctiva clear; Vision grossly intact; extraocular movements intact Neck. Supple.  Lungs. Respirations unlabored; clear to auscultation bilaterally Cardiovascular. Heart regular rate and rhythm without murmurs, gallops, or rubs Abdomen. Soft; non tender; non distended; no masses or organomegaly Extremities. no edema Skin. Normal color and turgor Neurologic. Alert and oriented x3; CN 2-12 grossly intact; no focal deficits  Assessment and Plan  1. Blood pressure elevated without history of HTN Acute.  Asymptomatic.  No meds or dx.  Monitor bp 2x/wk and call me if >140/90.    2. 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  Focus on lifestyle modifications.  No need to check CBGs. -     Hemoglobin A1C; Future  3. Pure hypercholesterolemia (LDL 71 - 07/11/24) Well controlled.  LFTs wnls (07/2024). Continue with current regimen.  Counseled on mediterranean diet and aerobic exercise. -     CBC  w/auto Differential (5 Part); Future -     Comprehensive Metabolic Panel (CMP); Future -     Lipid Panel w/calc LDL; Future  4. Fatty liver disease, nonalcoholic Chronic.  AST 41 and ALT 65.  Focus on exercise and nutrition modification.  Decrease carbs/starches and fatty foods.  Monitor for now.  Asymptomatic. -     Comprehensive Metabolic Panel (CMP); Future  5. Benign prostatic hyperplasia with weak urinary stream New.  Discussed diagnosis and treatment options.  Patient deferred on prescription meds.  May consider over-the-counter saw palmetto.  Other orders -     Follow up in Primary Care -      Follow up in Primary Care; Future  F/u 6 months for PE; labs prior  ALDA CARPEN, MD

## 2024-08-03 ENCOUNTER — Other Ambulatory Visit: Payer: Self-pay

## 2024-08-03 ENCOUNTER — Emergency Department
Admission: EM | Admit: 2024-08-03 | Discharge: 2024-08-03 | Disposition: A | Discharge status: Home / Self Care | Attending: Emergency Medicine | Admitting: Emergency Medicine

## 2024-08-03 ENCOUNTER — Encounter: Payer: Self-pay | Admitting: Emergency Medicine

## 2024-08-03 DIAGNOSIS — I10 Essential (primary) hypertension: Secondary | ICD-10-CM | POA: Diagnosis not present

## 2024-08-03 DIAGNOSIS — R42 Dizziness and giddiness: Secondary | ICD-10-CM | POA: Diagnosis present

## 2024-08-03 LAB — CBC WITH DIFFERENTIAL/PLATELET
Abs Immature Granulocytes: 0.02 K/uL (ref 0.00–0.07)
Basophils Absolute: 0.1 K/uL (ref 0.0–0.1)
Basophils Relative: 1 %
Eosinophils Absolute: 0.5 K/uL (ref 0.0–0.5)
Eosinophils Relative: 8 %
HCT: 43.2 % (ref 39.0–52.0)
Hemoglobin: 15.4 g/dL (ref 13.0–17.0)
Immature Granulocytes: 0 %
Lymphocytes Relative: 31 %
Lymphs Abs: 2 K/uL (ref 0.7–4.0)
MCH: 30.3 pg (ref 26.0–34.0)
MCHC: 35.6 g/dL (ref 30.0–36.0)
MCV: 84.9 fL (ref 80.0–100.0)
Monocytes Absolute: 0.6 K/uL (ref 0.1–1.0)
Monocytes Relative: 10 %
Neutro Abs: 3.3 K/uL (ref 1.7–7.7)
Neutrophils Relative %: 50 %
Platelets: 239 K/uL (ref 150–400)
RBC: 5.09 MIL/uL (ref 4.22–5.81)
RDW: 11.9 % (ref 11.5–15.5)
WBC: 6.4 K/uL (ref 4.0–10.5)
nRBC: 0 % (ref 0.0–0.2)

## 2024-08-03 LAB — COMPREHENSIVE METABOLIC PANEL WITH GFR
ALT: 57 U/L — ABNORMAL HIGH (ref 0–44)
AST: 47 U/L — ABNORMAL HIGH (ref 15–41)
Albumin: 3.8 g/dL (ref 3.5–5.0)
Alkaline Phosphatase: 38 U/L (ref 38–126)
Anion gap: 8 (ref 5–15)
BUN: 14 mg/dL (ref 8–23)
CO2: 24 mmol/L (ref 22–32)
Calcium: 8.8 mg/dL — ABNORMAL LOW (ref 8.9–10.3)
Chloride: 104 mmol/L (ref 98–111)
Creatinine, Ser: 1.02 mg/dL (ref 0.61–1.24)
GFR, Estimated: >60 mL/min (ref >60)
Glucose, Bld: 127 mg/dL — ABNORMAL HIGH (ref 70–99)
Potassium: 4 mmol/L (ref 3.5–5.1)
Sodium: 136 mmol/L (ref 135–145)
Total Bilirubin: 1 mg/dL (ref 0.0–1.2)
Total Protein: 7.2 g/dL (ref 6.5–8.1)

## 2024-08-03 MED ORDER — AMLODIPINE BESYLATE 5 MG PO TABS
5.0000 mg | ORAL_TABLET | Freq: Once | ORAL | Status: AC
  Administered 2024-08-03: 5 mg via ORAL
  Filled 2024-08-03: qty 1

## 2024-08-03 MED ORDER — AMLODIPINE BESYLATE 5 MG PO TABS: 5.0000 mg | ORAL_TABLET | Freq: Every day | ORAL | 2 refills | Status: AC

## 2024-08-03 NOTE — ED Triage Notes (Signed)
 Patient c/o htn.  Patient states he used to take BP meds but was taken off them.  Patient states he is asymptomatic, he just wants to get his BP under control.

## 2024-08-03 NOTE — Discharge Instructions (Signed)
 You were seen in the ER today for your elevated blood pressure.  We fortunately did not find any emergency findings related to this.  With multiple elevated blood pressure readings, I do think it is reasonable to go ahead and start you on a blood pressure medication.  I sent a prescription for amlodipine  to your pharmacy.  Please take this as directed and follow-up with your primary care doctor for further evaluation.  If you develop chest pain, shortness of breath, severe headache, or any other new or concerning symptoms, please return to the ER for further evaluation.

## 2024-08-03 NOTE — ED Provider Notes (Signed)
 [facility] Provider Note    Event Date/Time   First MD Initiated Contact with Patient 08/03/24 2302     (approximate)   History   Hypertension   HPI  Chad Henderson is a  68 year old male with history of NSTEMI, hypercholesterolemia presenting to the emergency department for evaluation of elevated blood pressure.  Patient denies known history of hypertension, but at primary care visit earlier this month, was noted to have elevated blood pressure.  Plan for monitoring, not started on antihypertensives at that time.  He does note that several years ago he was on Coreg  in the setting of CAD, but discontinued this after about a year at the direction of his cardiologist.  Did report some dizziness when taking this.  He has been using 2 different blood pressure monitor cuffs at home to check his blood pressure recently and noticed that they have been persistently elevated with systolics up to 180s over diastolics in the 90s.  He denies any chest pain, shortness of breath, headache.  However he was concerned about his elevated blood pressure readings present to the ER.  Reviewed PCP visit from 07/25/2024.  At that time patient was noted to have elevated blood pressure at 162/86, 150/82 on repeat.  Plan for blood pressure monitoring at that time, not started on medication.      Physical Exam   Triage Vital Signs: ED Triage Vitals  Encounter Vitals Group     BP 08/03/24 2118 (!) 184/78     Girls Systolic BP Percentile --      Girls Diastolic BP Percentile --      Boys Systolic BP Percentile --      Boys Diastolic BP Percentile --      Pulse Rate 08/03/24 2117 76     Resp 08/03/24 2117 18     Temp 08/03/24 2117 98.1 F (36.7 C)     Temp src --      SpO2 08/03/24 2117 96 %     Weight 08/03/24 2117 205 lb (93 kg)     Height --      Head Circumference --      Peak Flow --      Pain Score 08/03/24 2117 0     Pain Loc --      Pain Education --       Exclude from Growth Chart --     Most recent vital signs: Vitals:   08/03/24 2117 08/03/24 2118  BP:  (!) 184/78  Pulse: 76   Resp: 18   Temp: 98.1 F (36.7 C)   SpO2: 96%      General: Awake, interactive  CV:  Regular rate, good peripheral perfusion.  Resp:  Unlabored respirations, lungs clear to auscultation Abd:  Nondistended.  Neuro:  Symmetric facial movement, fluid speech, moving extremity spontaneously and equally   ED Results / Procedures / Treatments   Labs (all labs ordered are listed, but only abnormal results are displayed) Labs Reviewed  COMPREHENSIVE METABOLIC PANEL WITH GFR - Abnormal; Notable for the following components:      Result Value   Glucose, Bld 127 (*)    Calcium  8.8 (*)    AST 47 (*)    ALT 57 (*)    All other components within normal limits  CBC WITH DIFFERENTIAL/PLATELET     EKG EKG independently reviewed and interpreted by myself demonstrates:    RADIOLOGY Imaging independently reviewed and interpreted by myself demonstrates:   Formal  Radiology Read:  No results found.  PROCEDURES:  Critical Care performed: No  Procedures   MEDICATIONS ORDERED IN ED: Medications  amLODipine  (NORVASC ) tablet 5 mg (has no administration in time range)     IMPRESSION / MDM / ASSESSMENT AND PLAN / ED COURSE  I reviewed the triage vital signs and the nursing notes.  Differential diagnosis includes, but is not limited to, hypertensive urgency, no evidence of aortic dissection, flash pulmonary edema, intracranial bleed, consideration for renal impairment  Patient's presentation is most consistent with acute presentation with potential threat to life or bodily function.  68 year old male presenting to the emergency department for evaluation of hypertension, fortunately asymptomatic on presentation.  Is hypertensive on presentation at 184/78.  Labs with reassuring CBC, CMP without critical derangements.  Normal creatinine at 1.02.  No evidence  of hypertensive emergency.  Discussed with patient that in the setting of asymptomatic hypertension I do not recommend rapid lowering of his blood pressure, but with now multiple data points reflecting likely hypertension it is reasonable to start him on an antihypertensive and have him follow-up with his primary care doctor.  He is comfortable with this plan.  Will start patient on amlodipine .  Strict return precautions provided.  Patient discharged in stable condition.      FINAL CLINICAL IMPRESSION(S) / ED DIAGNOSES   Final diagnoses:  Uncontrolled hypertension     Rx / DC Orders   ED Discharge Orders          Ordered    amLODipine  (NORVASC ) 5 MG tablet  Daily        08/03/24 2327             Note:  This document was prepared using Dragon voice recognition software and may include unintentional dictation errors.   Levander Slate, MD 08/03/24 [id number]
